# Patient Record
Sex: Female | Born: 1960 | Race: White | Hispanic: No | Smoking: Former smoker
Health system: Southern US, Community
[De-identification: ages and names within clinical notes are randomized; demographics above are authoritative.]

## PROBLEM LIST (undated history)

## (undated) DIAGNOSIS — IMO0001 Reserved for inherently not codable concepts without codable children: Secondary | ICD-10-CM

## (undated) DIAGNOSIS — F329 Major depressive disorder, single episode, unspecified: Secondary | ICD-10-CM

## (undated) DIAGNOSIS — I639 Cerebral infarction, unspecified: Secondary | ICD-10-CM

## (undated) DIAGNOSIS — R29898 Other symptoms and signs involving the musculoskeletal system: Secondary | ICD-10-CM

## (undated) DIAGNOSIS — J439 Emphysema, unspecified: Secondary | ICD-10-CM

## (undated) DIAGNOSIS — E785 Hyperlipidemia, unspecified: Secondary | ICD-10-CM

## (undated) DIAGNOSIS — K219 Gastro-esophageal reflux disease without esophagitis: Secondary | ICD-10-CM

## (undated) DIAGNOSIS — F32A Depression, unspecified: Secondary | ICD-10-CM

## (undated) HISTORY — DX: Other symptoms and signs involving the musculoskeletal system: R29.898

## (undated) HISTORY — DX: Emphysema, unspecified: J43.9

## (undated) HISTORY — PX: ABDOMINAL HYSTERECTOMY: SHX81

## (undated) HISTORY — PX: CHOLECYSTECTOMY: SHX55

## (undated) HISTORY — PX: EYE SURGERY: SHX253

## (undated) HISTORY — PX: TOTAL ABDOMINAL HYSTERECTOMY: SHX209

---

## 1995-08-20 HISTORY — PX: COLON SURGERY: SHX602

## 1995-08-20 HISTORY — PX: TUBAL LIGATION: SHX77

## 2001-12-29 ENCOUNTER — Emergency Department (HOSPITAL_COMMUNITY): Admission: EM | Admit: 2001-12-29 | Discharge: 2001-12-29 | Payer: Self-pay | Admitting: Emergency Medicine

## 2003-08-20 DIAGNOSIS — I639 Cerebral infarction, unspecified: Secondary | ICD-10-CM

## 2003-08-20 HISTORY — DX: Cerebral infarction, unspecified: I63.9

## 2003-12-05 ENCOUNTER — Emergency Department (HOSPITAL_COMMUNITY): Admission: AD | Admit: 2003-12-05 | Discharge: 2003-12-05 | Payer: Self-pay | Admitting: Emergency Medicine

## 2004-05-18 ENCOUNTER — Ambulatory Visit (HOSPITAL_COMMUNITY): Admission: RE | Admit: 2004-05-18 | Discharge: 2004-05-18 | Payer: Self-pay | Admitting: Obstetrics

## 2004-10-11 ENCOUNTER — Encounter: Admission: RE | Admit: 2004-10-11 | Discharge: 2004-10-11 | Payer: Self-pay | Admitting: Obstetrics

## 2005-06-26 ENCOUNTER — Ambulatory Visit (HOSPITAL_COMMUNITY): Admission: RE | Admit: 2005-06-26 | Discharge: 2005-06-26 | Payer: Self-pay | Admitting: Obstetrics

## 2005-08-16 ENCOUNTER — Ambulatory Visit (HOSPITAL_COMMUNITY): Admission: RE | Admit: 2005-08-16 | Discharge: 2005-08-16 | Payer: Self-pay | Admitting: Obstetrics

## 2005-08-16 ENCOUNTER — Encounter (INDEPENDENT_AMBULATORY_CARE_PROVIDER_SITE_OTHER): Payer: Self-pay | Admitting: *Deleted

## 2007-03-03 ENCOUNTER — Emergency Department (HOSPITAL_COMMUNITY): Admission: EM | Admit: 2007-03-03 | Discharge: 2007-03-04 | Payer: Self-pay | Admitting: Emergency Medicine

## 2007-06-19 ENCOUNTER — Emergency Department (HOSPITAL_COMMUNITY): Admission: EM | Admit: 2007-06-19 | Discharge: 2007-06-19 | Payer: Self-pay | Admitting: Emergency Medicine

## 2008-05-17 ENCOUNTER — Encounter: Admission: RE | Admit: 2008-05-17 | Discharge: 2008-05-17 | Payer: Self-pay | Admitting: Obstetrics

## 2008-08-04 ENCOUNTER — Emergency Department (HOSPITAL_COMMUNITY): Admission: EM | Admit: 2008-08-04 | Discharge: 2008-08-04 | Payer: Self-pay | Admitting: Emergency Medicine

## 2009-02-09 DIAGNOSIS — A6000 Herpesviral infection of urogenital system, unspecified: Secondary | ICD-10-CM | POA: Insufficient documentation

## 2009-03-10 ENCOUNTER — Encounter: Payer: Self-pay | Admitting: Obstetrics

## 2009-03-10 ENCOUNTER — Inpatient Hospital Stay (HOSPITAL_COMMUNITY): Admission: RE | Admit: 2009-03-10 | Discharge: 2009-03-13 | Payer: Self-pay | Admitting: Obstetrics

## 2009-09-27 DIAGNOSIS — H9190 Unspecified hearing loss, unspecified ear: Secondary | ICD-10-CM | POA: Insufficient documentation

## 2009-10-09 DIAGNOSIS — F32A Depression, unspecified: Secondary | ICD-10-CM | POA: Insufficient documentation

## 2010-02-02 DIAGNOSIS — E559 Vitamin D deficiency, unspecified: Secondary | ICD-10-CM | POA: Insufficient documentation

## 2010-11-25 LAB — CBC
HCT: 34.5 % — ABNORMAL LOW (ref 36.0–46.0)
HCT: 42.6 % (ref 36.0–46.0)
Hemoglobin: 11.9 g/dL — ABNORMAL LOW (ref 12.0–15.0)
Hemoglobin: 14.8 g/dL (ref 12.0–15.0)
MCHC: 34.7 g/dL (ref 30.0–36.0)
MCHC: 34.7 g/dL (ref 30.0–36.0)
MCV: 90.9 fL (ref 78.0–100.0)
MCV: 92.5 fL (ref 78.0–100.0)
Platelets: 339 10*3/uL (ref 150–400)
Platelets: 363 10*3/uL (ref 150–400)
RBC: 3.72 MIL/uL — ABNORMAL LOW (ref 3.87–5.11)
RBC: 4.68 MIL/uL (ref 3.87–5.11)
RDW: 12.5 % (ref 11.5–15.5)
RDW: 12.7 % (ref 11.5–15.5)
WBC: 13.1 10*3/uL — ABNORMAL HIGH (ref 4.0–10.5)
WBC: 8.6 10*3/uL (ref 4.0–10.5)

## 2010-11-25 LAB — PREGNANCY, URINE: Preg Test, Ur: NEGATIVE

## 2011-01-01 NOTE — Discharge Summary (Signed)
Erica Bradley, FUHS                 ACCOUNT NO.:  [id number]   MEDICAL RECORD NO.:  [id number]          PATIENT TYPE:  INP   LOCATION:  9304                          FACILITY:  [facility]   PHYSICIAN:  Roseanna Rainbow, M.D.DATE OF BIRTH:  1961/04/21   DATE OF ADMISSION:  03/10/2009  DATE OF DISCHARGE:  03/13/2009                               DISCHARGE SUMMARY   CHIEF COMPLAINT:  The patient is a 50 year old with severe dysmenorrhea,  who presents for total laparoscopic hysterectomy.   HISTORY OF PRESENT ILLNESS:  Please see the above.  The patient has a  history of an endometrial ablation in December 2006.  The menstrual flow  improved after the procedure.   ALLERGIES:  SULFA.   MEDICATIONS:  Topamax, doxycycline, Flagyl, Depakote, nortriptyline,  Pristiq.   PAST SURGICAL HISTORY:  Bilateral tubal ligation, eye surgery,  cholecystectomy.  Please see the above.   PAST MEDICAL HISTORY:  Bipolar d/o.   FAMILY HISTORY:  Cardiovascular disease.   PHYSICAL EXAMINATION:  VITAL SIGNS:  Temperature 98.4, pulse 80,  respirations 18, blood pressure 152/82.  GENERAL APPEARANCE:  Well nourished, well developed in no apparent  distress.  HEAD, EYES, EARS, NOSE AND THROAT:  Normocephalic, atraumatic.  NECK:  Supple.  LUNGS:  Clear to auscultation bilaterally.  HEART:  Regular rate and rhythm.  BREASTS:  No masses.  ABDOMEN: Soft, nontender.  No organomegaly.  PELVIC:  Normal EGBUS.  Uterus is anteverted, normal size.  No prolapse.  Adnexa; no masses, no tenderness.  EXTREMITIES:  No clubbing, cyanosis or edema.  SKIN:  Without rash.   ASSESSMENT:  Secondary dysmenorrhea.   PLAN:  Total laparoscopic hysterectomy.   HOSPITAL COURSE:  The patient was admitted and underwent a diagnostic  laparoscopy, total abdominal hysterectomy, bilateral salpingo-  oophorectomy and lysis of adhesions.  Please see the dictated operative  summary.  On postoperative day #1, a hemoglobin was 11.  The  remainder  of her hospital course is uneventful.  She was discharged to home on  postoperative day #3, tolerating a regular diet.   DISCHARGE DIAGNOSES:  Endometriosis, pelvic adhesions.   PROCEDURES:  Diagnostic laparoscopy, total abdominal hysterectomy,  bilateral salpingo-oophorectomy and lysis of adhesions.   CONDITION:  Good.   DIET:  Regular.   ACTIVITY:  Pelvic rest, progressive activity.   DISCHARGE MEDICATIONS:  1. Depakote 1000 mg daily.  2. Topamax 100 mg daily.  3. Nortriptyline 150 mg daily.  4. Percocet 1-2 tablets every 6 hours as needed.  5. Climara Pro apply weekly.   DISPOSITION:  The patient was to follow up in the office on July 30th  for staple removal.       Roseanna Rainbow, M.D.  Electronically Signed     LAJ/MEDQ  D:  03/13/2009  T:  03/13/2009  Job:  [id number]   cc:   Leonette Most A. Clearance Coots, M.D.  Fax: [phone]

## 2011-01-01 NOTE — Op Note (Signed)
[name], Bradley                 ACCOUNT NO.:  [id number]   MEDICAL RECORD NO.:  [id number]          PATIENT TYPE:  OIB   LOCATION:  9304                          FACILITY:  [facility]   PHYSICIAN:  Charles A. Clearance Coots, M.D.DATE OF BIRTH:  Nov 22, 1960   DATE OF PROCEDURE:  03/10/2009  DATE OF DISCHARGE:                               OPERATIVE REPORT   PREOPERATIVE DIAGNOSIS:  Severe dysmenorrhea.   POSTOPERATIVE DIAGNOSES:  1. Severe dysmenorrhea.  2. Multiple pelvic adhesions.   PROCEDURES:  1. Diagnostic laparoscopy.  2. Total abdominal hysterectomy.  3. Bilateral salpingo-oophorectomy.  4. Lysis of adhesions.   SURGEON:  Charles A. Clearance Coots, MD   ASSISTANT:  Roseanna Rainbow, MD   ANESTHESIA:  General.   ESTIMATED BLOOD LOSS:  800 mL   COMPLICATIONS:  None.   DRAINS:  Foley to gravity.   FINDINGS:  Multiple pelvic adhesions.   SPECIMEN:  Uterus, ovaries, fallopian tubes, and cervix.   DISPOSITION:  Specimen to Pathology.   OPERATION:  The patient was brought to the operating room and after  satisfactory general endotracheal anesthesia, the legs were brought up  in stirrups.  The abdomen and vagina were prepped and draped in the  usual sterile fashion.  The vaginal set up was done in routine fashion  for total laparoscopic hysterectomy. The small umbilical incision was  made with the scalpel and the OptiVu was then entered into the  peritoneal cavity in routine fashion without complications.  Two lateral  lower quadrant 5-mm ports were then placed.  Survey of the pelvis  revealed multiple pelvic adhesions.  The uterus not being very mobile  and an attempt was made to lyse some of the adhesions laparoscopically,  but after starting the lysis of adhesions laparoscopically it was  realized that the adhesions were too dense and the adhesive disease was  too complex for a laparoscopic approach.  A decision was made to abort  the laparoscopic attempt at hysterectomy and  proceed with open abdominal  hysterectomy.  All laparoscopic instruments were all removed from the  ports and a midline vertical subumbilical incision was made with a  scalpel down to the fascia.  The fascia was nicked in the midline and  the fascial incision was extended superiorly and inferiorly with curved  Mayo scissors.  The rectus muscle was then divided in the midline and  the peritoneum was entered in the subumbilical area where the OptiVu  trocar had previously entered.  The peritoneal incision was then  extended inferiorly down to the urinary bladder being careful to avoid  the urinary bladder.  The Bookwalter retractor was then placed in the  incision and the anterior and posterior blades were placed along with  two lateral blades.  The uterus was tightly bound down in pelvic  adhesions and these were bluntly and sharply lysed with Metzenbaum  scissors and bluntly.  The retroperitoneal space was then entered  laterally on the right side through the broad ligament and lateral  peritoneal surfaces and what appeared to be approximately 4-5 cm fluid-  filled mass was observed below  the ovary extending out  retroperitoneally.  The peritoneal incisions were then extended sharply  to the infundibulopelvic vessels on the right side and the peritoneal  space was then further developed down to the major vessels and the  ureter was identified on the right side and the infundibulopelvic  ligament with the vessels were then cross-clamped above the ureter with  clear visualization of the ureter.  With a curved Heaney clamp, the  infundibulopelvic vessels were doubly clamped, cut and free tie of 0  Vicryl was placed beneath the clamp and a transfixion suture of 0 Vicryl  was placed above the knot.  The vesicouterine fold of peritoneum above  the reflection of the urinary bladder was then undermined and incised  with Metzenbaum scissors and the urinary bladder was separated from the  lower  uterine segment and cervix, and placing it well out of the  operative field with blunt and sharp dissection.  Further lysis of  adhesions were done and the uterine vessels were skeletonized on the  right side and were doubly clamped with curved Heaney forceps and a  straight Heaney forceps placed above the bottom clamp.  The uterine  vessels were then cut and suture ligated with transfixion sutures of 0  Vicryl.  Same procedure was performed on the opposite side without  complications.  The cardinal ligaments were then clamped, cut and suture  ligated with transfixion sutures of 0 Vicryl down to the uterosacral  ligaments which were clamped, cut and suture ligated with transfixion  sutures of 0 Vicryl and were held with hemostats for further closure of  the vaginal cuff.  The uterus had been transected at the uterocervical  junction for better exposure to remove the cervical stump.  Curved  Heaney clamps were then placed across each corner of the vagina, meeting  in the center and the cervix was transected away from the vaginal cuff.  The vaginal cuff was then closed with corner transfixion suture of 0  Vicryl on each side and the center of the vaginal cuff was closed with  interrupted sutures of 0 Vicryl.  Hemostasis was excellent.  Pelvic  cavity was thoroughly irrigated with warm saline solution and all clots  were removed.  All the pedicles and peritoneal surfaces were examined  for hemostasis and areas of bleeding were coagulated with the Bovie or  suture ligated.  There was no active bleeding noted at this point, and  all packing and the Bookwalter retractor was then removed.  Peritoneum  was grasped with Linzie forceps.  The peritoneum and fascia was closed as  one with continuous suture of 0 Vicryl from each corner to the center.  Subcutaneous tissue was thoroughly irrigated with warm saline solution  and all areas of subcutaneous bleeding were coagulated with the Bovie.  The skin  was then closed with sterile surgical stainless steel staples.  Sterile bandage was applied to the incision closure.  Surgical  technician indicated that all needle, sponge, and instrument counts were  correct x2.  The patient tolerated the procedure well and was  transported to the recovery room in satisfactory condition.      Charles A. Clearance Coots, M.D.  Electronically Signed     CAH/MEDQ  D:  03/10/2009  T:  03/11/2009  Job:  [id number]

## 2011-01-04 NOTE — Op Note (Signed)
Erica Bradley, Erica Bradley                 ACCOUNT NO.:  [id number]   MEDICAL RECORD NO.:  [id number]          PATIENT TYPE:  AMB   LOCATION:  SDC                           FACILITY:  [facility]   PHYSICIAN:  Charles A. Clearance Coots, M.D.DATE OF BIRTH:  [birth year]   DATE OF PROCEDURE:  08/16/2005  DATE OF DISCHARGE:                                 OPERATIVE REPORT   PREOPERATIVE DIAGNOSIS:  Menorrhagia.   POSTOPERATIVE DIAGNOSIS:  Menorrhagia.   PROCEDURE:  Hysteroscopy, dilation and curettage, NovaSure bipolar  endometrial ablation.   SURGEON:  Charles A. Clearance Coots, M.D.   ANESTHESIA:  General.   ESTIMATED BLOOD LOSS:  Minimal.   COMPLICATIONS:  None.   FINDINGS:  Endometrial cavity clear without polyps or submucous fibroids on  hysteroscopic survey.   OPERATION:  The patient was brought to the operating room and after  satisfactory general endotracheal anesthesia, the vagina was prepped and  draped in the usual sterile fashion.  The urinary bladder was emptied of  approximately 100 mL of clear urine.  Bimanual examination revealed the  uterus to be midposition, normal size, shape and contour.  A weighted  speculum was inserted in the vaginal vault and the Sims retractor was used  to isolate the cervix.  The anterior lip of the cervix was grasped with a  single-tooth tenaculum.  The cervical canal was then sounded with a Hegar  dilator up to the internal os, which was 4 cm in length.  The cervix was  then dilated to a #23 Pratt dilator.  The hysteroscope was introduced into  the uterine cavity and a hysteroscopic survey was done.  There were no  endometrial polyps or submucous fibroids observed.  The hysteroscope was  removed.  A small sharp curette was then used to perform endometrial  curettage and the specimen was submitted to pathology for evaluation.  NovaSure bipolar endometrial ablation was then performed after sounding the  uterus to 10 cm with a cervical length of 4 cm, with a total what  cavity  length of 6 cm.  Cavity width was 4.9 cm.  Bipolar ablation was then  performed in routine fashion at a power of 162 watts, time of 86 seconds.  All instruments were then retired at the conclusion of the procedure.  Hysteroscopy was again performed and adequate ablation was confirmed.  All  instruments were  then retired, and the patient was reversed from the  general anesthetic and was taken to the recovery room in good condition.      Charles A. Clearance Coots, M.D.  Electronically Signed    CAH/MEDQ  D:  08/16/2005  T:  08/16/2005  Job:  [id number]

## 2011-05-24 LAB — DIFFERENTIAL
Basophils Absolute: 0.1 10*3/uL (ref 0.0–0.1)
Basophils Relative: 1 % (ref 0–1)
Eosinophils Absolute: 0.1 10*3/uL (ref 0.0–0.7)
Eosinophils Relative: 1 % (ref 0–5)
Lymphocytes Relative: 24 % (ref 12–46)
Lymphs Abs: 2.6 10*3/uL (ref 0.7–4.0)
Monocytes Absolute: 0.5 10*3/uL (ref 0.1–1.0)
Monocytes Relative: 5 % (ref 3–12)
Neutro Abs: 7.5 10*3/uL (ref 1.7–7.7)
Neutrophils Relative %: 69 % (ref 43–77)

## 2011-05-24 LAB — CBC
HCT: 45.6 % (ref 36.0–46.0)
Hemoglobin: 15.1 g/dL — ABNORMAL HIGH (ref 12.0–15.0)
MCHC: 33.1 g/dL (ref 30.0–36.0)
MCV: 89.2 fL (ref 78.0–100.0)
Platelets: 322 10*3/uL (ref 150–400)
RBC: 5.11 MIL/uL (ref 3.87–5.11)
RDW: 13 % (ref 11.5–15.5)
WBC: 10.9 10*3/uL — ABNORMAL HIGH (ref 4.0–10.5)

## 2011-05-24 LAB — GC/CHLAMYDIA PROBE AMP, GENITAL
Chlamydia, DNA Probe: POSITIVE — AB
GC Probe Amp, Genital: NEGATIVE

## 2011-05-24 LAB — POCT I-STAT, CHEM 8
BUN: 21 mg/dL (ref 6–23)
Calcium, Ion: 1.33 mmol/L — ABNORMAL HIGH (ref 1.12–1.32)
Chloride: 107 mEq/L (ref 96–112)
Creatinine, Ser: 1 mg/dL (ref 0.4–1.2)
Glucose, Bld: 94 mg/dL (ref 70–99)
HCT: 48 % — ABNORMAL HIGH (ref 36.0–46.0)
Hemoglobin: 16.3 g/dL — ABNORMAL HIGH (ref 12.0–15.0)
Potassium: 4 mEq/L (ref 3.5–5.1)
Sodium: 139 mEq/L (ref 135–145)
TCO2: 25 mmol/L (ref 0–100)

## 2011-05-24 LAB — WET PREP, GENITAL
Clue Cells Wet Prep HPF POC: NONE SEEN
Trich, Wet Prep: NONE SEEN
WBC, Wet Prep HPF POC: NONE SEEN
Yeast Wet Prep HPF POC: NONE SEEN

## 2011-05-29 LAB — I-STAT 8, (EC8 V) (CONVERTED LAB)
Acid-base deficit: 4 — ABNORMAL HIGH
BUN: 14
Bicarbonate: 21.6
Chloride: 108
Glucose, Bld: 89
HCT: 45
Hemoglobin: 15.3 — ABNORMAL HIGH
Operator id: 288831
Potassium: 4.1
Sodium: 138
TCO2: 23
pCO2, Ven: 41 — ABNORMAL LOW
pH, Ven: 7.329 — ABNORMAL HIGH

## 2011-05-29 LAB — CBC
HCT: 42.6
Hemoglobin: 14.5
MCHC: 34
MCV: 91
Platelets: 344
RBC: 4.68
RDW: 12.7
WBC: 7.2

## 2011-05-29 LAB — POCT I-STAT CREATININE
Creatinine, Ser: 1
Operator id: 288831

## 2011-05-29 LAB — DIFFERENTIAL
Basophils Absolute: 0.1
Basophils Relative: 2 — ABNORMAL HIGH
Eosinophils Absolute: 0.2
Eosinophils Relative: 3
Lymphocytes Relative: 35
Lymphs Abs: 2.5
Monocytes Absolute: 0.4
Monocytes Relative: 6
Neutro Abs: 3.9
Neutrophils Relative %: 54

## 2011-05-29 LAB — POCT CARDIAC MARKERS
CKMB, poc: <1 — ABNORMAL LOW
Myoglobin, poc: 88.2
Operator id: 288831
Troponin i, poc: <0.05
Troponin i, poc: <0.05

## 2011-06-03 LAB — URINALYSIS, ROUTINE W REFLEX MICROSCOPIC
Bilirubin Urine: NEGATIVE
Glucose, UA: NEGATIVE
Nitrite: NEGATIVE
Specific Gravity, Urine: 1.008
pH: 6.5

## 2011-06-03 LAB — DIFFERENTIAL
Eosinophils Relative: 3
Lymphocytes Relative: 54 — ABNORMAL HIGH
Lymphs Abs: 4.1 — ABNORMAL HIGH
Monocytes Absolute: 0.5
Monocytes Relative: 6

## 2011-06-03 LAB — CBC
HCT: 40.8
Hemoglobin: 14.2
WBC: 7.6

## 2011-06-03 LAB — BASIC METABOLIC PANEL
GFR calc non Af Amer: 55 — ABNORMAL LOW
Potassium: 3.7
Sodium: 134 — ABNORMAL LOW

## 2013-04-29 ENCOUNTER — Encounter: Payer: Self-pay | Admitting: *Deleted

## 2013-04-29 ENCOUNTER — Emergency Department
Admission: EM | Admit: 2013-04-29 | Discharge: 2013-04-29 | Disposition: A | Discharge status: Home / Self Care | Payer: Self-pay | Source: Home / Self Care | Attending: Emergency Medicine | Admitting: Emergency Medicine

## 2013-04-29 DIAGNOSIS — R1900 Intra-abdominal and pelvic swelling, mass and lump, unspecified site: Secondary | ICD-10-CM

## 2013-04-29 DIAGNOSIS — H6121 Impacted cerumen, right ear: Secondary | ICD-10-CM

## 2013-04-29 DIAGNOSIS — H6120 Impacted cerumen, unspecified ear: Secondary | ICD-10-CM

## 2013-04-29 DIAGNOSIS — D229 Melanocytic nevi, unspecified: Secondary | ICD-10-CM

## 2013-04-29 DIAGNOSIS — D239 Other benign neoplasm of skin, unspecified: Secondary | ICD-10-CM

## 2013-04-29 HISTORY — DX: Hyperlipidemia, unspecified: E78.5

## 2013-04-29 NOTE — ED Notes (Signed)
Erica Bradley c/o possible piece of cotton in her right ear for a couple months seen by a hearing specialists who could not take it our, no pain. Mole to right ribs, black in color x 3 years. Knot to lower abdomen x 9 months, denies pain.

## 2013-04-29 NOTE — ED Provider Notes (Signed)
CSN: [id number]     Arrival date & time 04/29/13  4098 History   First MD Initiated Contact with Patient 04/29/13 1012     Chief Complaint  Patient presents with  . Foreign Body in Ear    possible piece of cotton  . Nevus   (Consider location/radiation/quality/duration/timing/severity/associated sxs/prior Treatment) HPI  The patient presents with multiple medical concerns: 1) first she has a mole on her left flank that has been there for a couple years.  It is dark but has not enlarged in the last 2 years.  She states that when she was younger she did have a lot of sun exposure. 2) second she has had some abdominal swelling or a knot in her lower stomach for the last year.  There is no pain but she occasionally feels a knot there.  She did have an abdominal hysterectomy done 4 years ago.  No nausea vomiting fever chills.  No other constitutional symptoms. 3) third she states that she saw an audiologist last year and was told that she had some cotton in her right ear.  No pain or irritation or swelling.  She will like to have that checked out today as well. Past Medical History  Diagnosis Date  . Back complaints   . Hyperlipidemia    Past Surgical History  Procedure Laterality Date  . Abdominal hysterectomy    . Cholecystectomy    . Tubal ligation    . Eye surgery     Family History  Problem Relation Age of Onset  . Cancer Mother     lung ca   History  Substance Use Topics  . Smoking status: Current Every Day Smoker -- 0.25 packs/day    Types: Cigarettes  . Smokeless tobacco: Never Used     Comment: quit  ten years ago.  . Alcohol Use: Yes   OB History   Grav Para Term Preterm Abortions TAB SAB Ect Mult Living                 Review of Systems  All other systems reviewed and are negative.    Allergies  Sulfur  Home Medications   Current Outpatient Rx  Name  Route  Sig  Dispense  Refill  . nortriptyline (PAMELOR) 75 MG capsule   Oral   Take 75 mg by mouth at  bedtime.          BP 140/101  Pulse 96  Temp(Src) 97.5 F (36.4 C) (Oral)  Resp 14  Ht 5\' 5"  (1.651 m)  Wt 185 lb (83.915 kg)  BMI 30.79 kg/m2  SpO2 100% Physical Exam  Nursing note and vitals reviewed. Constitutional: She is oriented to person, place, and time. She appears well-developed and well-nourished.  HENT:  Head: Normocephalic and atraumatic.  Nose: Nose normal.  Left ear is normal.  Cerumen in right ear.  No cotton or foreign bodies are seen.  Eyes: No scleral icterus.  Neck: Neck supple.  Cardiovascular: Regular rhythm and normal heart sounds.   Pulmonary/Chest: Effort normal and breath sounds normal. No respiratory distress.  Abdominal:    Neurological: She is alert and oriented to person, place, and time.  Skin: Skin is warm and dry.     Psychiatric: She has a normal mood and affect. Her speech is normal.    ED Course  Procedures (including critical care time) Labs Review Labs Reviewed - No data to display Imaging Review No results found.  MDM   1. Impacted ear wax,  right   2. Pigmented nevus   3. Abdominal swelling     1) For the mole, it is large and probably needs to be biopsied at least if not excised altogether, I would prefer that the patient does see a dermatologist for this procedure as it may be more involved /larger than we're prepared to do.  And since patient with no insurance, a derm may decide to just watch if just a benign nevus.   2) For the abdominal swelling, likely is a insertional hernia versus old scar tissue.  I do not see any red flags therefore advised the patient that she can watch it.  Alternatives would be to get an ultrasound and a surgical consult.  However patient states that she does not have insurance and cannot afford that right now.  So she will watch it and see if there is any pain or enlarging or worsening and then followup.  3)  For the ear, I don't see any cotton in the ear but she does have wax.  We are going to  flush that out and see if we can see anything more in there.  No signs of infection.  After flushing that he still cannot see any foreign bodies in her ear and no erythema in the wax has been removed.   Marlaine Hind, MD 04/29/13 1059

## 2013-10-28 ENCOUNTER — Encounter: Payer: Self-pay | Admitting: Family Medicine

## 2013-10-28 ENCOUNTER — Ambulatory Visit (INDEPENDENT_AMBULATORY_CARE_PROVIDER_SITE_OTHER): Payer: Commercial Managed Care - PPO | Admitting: Family Medicine

## 2013-10-28 ENCOUNTER — Encounter: Payer: Self-pay | Admitting: *Deleted

## 2013-10-28 VITALS — BP 149/96 | HR 102 | Ht 65.0 in | Wt 173.0 lb

## 2013-10-28 DIAGNOSIS — E785 Hyperlipidemia, unspecified: Secondary | ICD-10-CM

## 2013-10-28 DIAGNOSIS — L309 Dermatitis, unspecified: Secondary | ICD-10-CM

## 2013-10-28 DIAGNOSIS — Z860100 Personal history of colon polyps, unspecified: Secondary | ICD-10-CM | POA: Insufficient documentation

## 2013-10-28 DIAGNOSIS — I6529 Occlusion and stenosis of unspecified carotid artery: Secondary | ICD-10-CM | POA: Insufficient documentation

## 2013-10-28 DIAGNOSIS — D649 Anemia, unspecified: Secondary | ICD-10-CM

## 2013-10-28 DIAGNOSIS — L259 Unspecified contact dermatitis, unspecified cause: Secondary | ICD-10-CM

## 2013-10-28 DIAGNOSIS — L820 Inflamed seborrheic keratosis: Secondary | ICD-10-CM

## 2013-10-28 DIAGNOSIS — IMO0001 Reserved for inherently not codable concepts without codable children: Secondary | ICD-10-CM

## 2013-10-28 DIAGNOSIS — R030 Elevated blood-pressure reading, without diagnosis of hypertension: Secondary | ICD-10-CM

## 2013-10-28 DIAGNOSIS — Z131 Encounter for screening for diabetes mellitus: Secondary | ICD-10-CM

## 2013-10-28 DIAGNOSIS — Z8673 Personal history of transient ischemic attack (TIA), and cerebral infarction without residual deficits: Secondary | ICD-10-CM | POA: Insufficient documentation

## 2013-10-28 DIAGNOSIS — G459 Transient cerebral ischemic attack, unspecified: Secondary | ICD-10-CM

## 2013-10-28 MED ORDER — TRIAMCINOLONE ACETONIDE 0.1 % EX CREA: TOPICAL_CREAM | CUTANEOUS | Status: DC

## 2013-10-28 MED ORDER — ASPIRIN 81 MG PO TABS: 81.0000 mg | ORAL_TABLET | Freq: Every day | ORAL | Status: DC

## 2013-10-28 NOTE — Progress Notes (Signed)
CC: Erica Bradley is a 53 y.o. female is here for Establish Care and wants lab work   Subjective: HPI:  Pleasant 53 year old here to establish care who has not had medical care in many years now  She reports a history of colonic polyps that were precancerous removed in 2011 she was due for followup in 3 years after that colonoscopy. She denies any unintentional weight loss or rectal bleeding. Denies abdominal pain  On chart review she has a history of anemia she is not currently taking any vitamin D or iron supplementation. She denies exertional chest pain but does endorse sternal twinge when sitting for long periods of time. Denies shortness of breath nor bruising or bleeding abnormalities recently or remotely.  She points out a rash on both forearms and both shins as in present for the past one to 2 weeks. It came on after she began using a used mattress and has slightly been improving since she discarded said mattress, it is slightly itchy but does not bother her otherwise. She's been using lotion which may or may not helping the resolution.  She reports a history of a transient ischemic attack that occurred when she was in her 41s. She was seen a neurologist following the episode who told her that she had narrowing of her right carotid artery but not to a significant degree that required surgery.  She was once on a statin but decided to stop it. She has a history of hyperlipidemia. She describes her TIA as right-sided facial weakness progressing to right arm and leg weakness that lasted a matter of less than 1 minute and resolved completely without intervention. She tells me she had an MRI afterwards which did not show a stroke. She's no longer taking a baby aspirin for no particular reason  She has a mole on her left flank that she is concerned as a melanoma due to her family history of malignant melanoma. The small has been there for over 2 years has not been getting bigger or smaller however  is quite irritated when she wears a bra. No interventions as of yet. Denies new or enlarging pigmented skin lesions elsewhere on the body.  Review of Systems - General ROS: negative for - chills, fever, night sweats, weight gain or weight loss Ophthalmic ROS: negative for - decreased vision Psychological ROS: negative for - anxiety or depression ENT ROS: negative for - hearing change, nasal congestion, tinnitus or allergies Hematological and Lymphatic ROS: negative for - bleeding problems, bruising or swollen lymph nodes Breast ROS: negative Respiratory ROS: no cough, shortness of breath, or wheezing Cardiovascular ROS: no  dyspnea on exertion Gastrointestinal ROS: no abdominal pain, change in bowel habits, or black or bloody stools Genito-Urinary ROS: negative for - genital discharge, genital ulcers, incontinence or abnormal bleeding from genitals Musculoskeletal ROS: negative for - joint pain or muscle pain Neurological ROS: negative for - headaches or memory loss Dermatological ROS: negative for lumps, mole changes, rash and skin lesion changes other than that described above   Past Medical History  Diagnosis Date  . Back complaints   . Hyperlipidemia     Past Surgical History  Procedure Laterality Date  . Abdominal hysterectomy    . Cholecystectomy    . Tubal ligation    . Eye surgery     Family History  Problem Relation Age of Onset  . Cancer Mother     lung ca    History   Social History  . Marital  Status: Divorced    Spouse Name: N/A    Number of Children: N/A  . Years of Education: N/A   Occupational History  . Not on file.   Social History Main Topics  . Smoking status: Current Every Day Smoker -- 0.25 packs/day    Types: Cigarettes  . Smokeless tobacco: Never Used     Comment: quit  ten years ago.  . Alcohol Use: Yes  . Drug Use: No  . Sexual Activity: Not on file   Other Topics Concern  . Not on file   Social History Narrative  . No narrative on  file     Objective: BP 149/96  Pulse 102  Ht 5\' 5"  (1.651 m)  Wt 173 lb (78.472 kg)  BMI 28.79 kg/m2  General: Alert and Oriented, No Acute Distress HEENT: Pupils equal, round, reactive to light. Conjunctivae clear.  External ears unremarkable, canals clear with intact TMs with appropriate landmarks.  Middle ear appears open without effusion. Pink inferior turbinates.  Moist mucous membranes, pharynx without inflammation nor lesions.  Neck supple without palpable lymphadenopathy nor abnormal masses. Lungs: Clear to auscultation bilaterally, no wheezing/ronchi/rales.  Comfortable work of breathing. Good air movement. Cardiac: Regular rate and rhythm. Normal S1/S2.  No murmurs, rubs, nor gallops.   no carotid bruits Extremities: No peripheral edema.  Strong peripheral pulses.  Mental Status: No depression, anxiety, nor agitation. Skin: Warm and dry. There is a 1/2 cm diameter waxy stuck on appearing  Pigmented lesion on her left flank homogenous in color regular borders classic appearance of seborrheic keratosis. Macular and urticarial rash on both forearms and shins mild in severity with excoriations  Assessment & Plan: Elianne was seen today for establish care and wants lab work.  Diagnoses and associated orders for this visit:  Elevated blood pressure - BASIC METABOLIC PANEL WITH GFR  Anemia - CBC  Diabetes mellitus screening - BASIC METABOLIC PANEL WITH GFR  Dermatitis - triamcinolone cream (KENALOG) 0.1 %; Apply to affected areas twice a day for up to two weeks, avoid face.  Carotid stenosis  TIA (transient ischemic attack) - aspirin 81 MG tablet; Take 1 tablet (81 mg total) by mouth daily.  Hyperlipidemia - Lipid Profile  Inflamed seborrheic keratosis  Personal history of colonic polyps - Ambulatory referral to Gastroenterology    Elevated blood pressure: Checking renal function Anemia: Due for repeat hemoglobin  overdue for routine type II diabetic  screening Dermatitis: Does not appear that she has any active scabies or mites infection however this does look like contact irritation or even bedbugs therefore start triamcinolone, should continue to improve now that she has removed the offending mattress TIA: Awaiting outside records I strongly encouraged her to start on a daily baby aspirin Hyperlipidemia: Due for repeat annual lipid panel she will most likely need to start a statin given TIA Inflamed seborrheic keratosis: Discussed the option of cryotherapy which she is quite interested in, this was performed without complication today History of colonic polyps: Referral to GI for consideration of colonoscopy  Return in about 4 weeks (around 11/25/2013) for Routine Followup.  Cryotherapy Procedure Note  Pre-operative Diagnosis: Inflammed SK  Post-operative Diagnosis: Inflammed SK  Locations: Left flank on bra line  Indications: persistent pain  Anesthesia: None  Procedure Details  History of allergy to iodine: no. Pacemaker? no.  Patient informed of risks (permanent scarring, infection, light or dark discoloration, bleeding, infection, weakness, numbness and recurrence of the lesion) and benefits of the procedure and verbal informed  consent obtained.  The areas are treated with liquid nitrogen therapy, frozen until ice ball extended 2 mm beyond lesion, allowed to thaw, and treated again. The patient tolerated procedure well.  The patient was instructed on post-op care, warned that there may be blister formation, redness and pain. Recommend OTC analgesia as needed for pain.  Condition: Stable  Complications: none.  Plan: 1. Instructed to keep the area dry and covered for 24-48h and clean thereafter. 2. Warning signs of infection were reviewed.   3. Recommended that the patient use OTC analgesics as needed for pain.  4. Return in 1 month.

## 2013-11-08 ENCOUNTER — Encounter: Payer: Self-pay | Admitting: Gastroenterology

## 2013-11-18 ENCOUNTER — Ambulatory Visit (INDEPENDENT_AMBULATORY_CARE_PROVIDER_SITE_OTHER): Payer: Commercial Managed Care - PPO | Admitting: Sports Medicine

## 2013-11-18 ENCOUNTER — Ambulatory Visit (INDEPENDENT_AMBULATORY_CARE_PROVIDER_SITE_OTHER): Payer: Commercial Managed Care - PPO

## 2013-11-18 ENCOUNTER — Encounter: Payer: Self-pay | Admitting: Sports Medicine

## 2013-11-18 VITALS — BP 146/96 | HR 95 | Ht 68.0 in | Wt 177.0 lb

## 2013-11-18 DIAGNOSIS — R1013 Epigastric pain: Secondary | ICD-10-CM

## 2013-11-18 DIAGNOSIS — R141 Gas pain: Secondary | ICD-10-CM

## 2013-11-18 DIAGNOSIS — R110 Nausea: Secondary | ICD-10-CM

## 2013-11-18 DIAGNOSIS — R142 Eructation: Secondary | ICD-10-CM

## 2013-11-18 DIAGNOSIS — R143 Flatulence: Secondary | ICD-10-CM

## 2013-11-18 MED ORDER — SUCRALFATE 1 G PO TABS: 1.0000 g | ORAL_TABLET | Freq: Four times a day (QID) | ORAL | Status: DC

## 2013-11-18 MED ORDER — PROMETHAZINE HCL 25 MG PO TABS: 25.0000 mg | ORAL_TABLET | Freq: Four times a day (QID) | ORAL | Status: DC | PRN

## 2013-11-18 MED ORDER — ESOMEPRAZOLE MAGNESIUM 40 MG PO CPDR: DELAYED_RELEASE_CAPSULE | ORAL | Status: DC

## 2013-11-18 NOTE — Patient Instructions (Signed)
Peptic Ulcer A peptic ulcer is a sore in the lining of in your esophagus (esophageal ulcer), stomach (gastric ulcer), or in the first part of your small intestine (duodenal ulcer). The ulcer causes erosion into the deeper tissue. CAUSES  Normally, the lining of the stomach and the small intestine protects itself from the acid that digests food. The protective lining can be damaged by:  An infection caused by a bacterium called Helicobacter pylori (H. pylori).  Regular use of nonsteroidal anti-inflammatory drugs (NSAIDs), such as ibuprofen or aspirin.  Smoking tobacco. Other risk factors include being older than 50, drinking alcohol excessively, and having a family history of ulcer disease.  SYMPTOMS   Burning pain or gnawing in the area between the chest and the belly button.  Heartburn.  Nausea and vomiting.  Bloating. The pain can be worse on an empty stomach and at night. If the ulcer results in bleeding, it can cause:  Black, tarry stools.  Vomiting of bright red blood.  Vomiting of coffee ground looking materials. DIAGNOSIS  A diagnosis is usually made based upon your history and an exam. Other tests and procedures may be performed to find the cause of the ulcer. Finding a cause will help determine the best treatment. Tests and procedures may include:  Blood tests, stool tests, or breath tests to check for the bacterium H. pylori.  An upper gastrointestinal (GI) series of the esophagus, stomach, and small intestine.  An endoscopy to examine the esophagus, stomach, and small intestine.  A biopsy. TREATMENT  Treatment may include:  Eliminating the cause of the ulcer, such as smoking, NSAIDs, or alcohol.  Medicines to reduce the amount of acid in your digestive tract.  Antibiotic medicines if the ulcer is caused by the H. pylori bacterium.  An upper endoscopy to treat a bleeding ulcer.  Surgery if the bleeding is severe or if the ulcer created a hole somewhere in the  digestive system. HOME CARE INSTRUCTIONS   Avoid tobacco, alcohol, and caffeine. Smoking can increase the acid in the stomach, and continued smoking will impair the healing of ulcers.  Avoid foods and drinks that seem to cause discomfort or aggravate your ulcer.  Only take medicines as directed by your caregiver. Do not substitute over-the-counter medicines for prescription medicines without talking to your caregiver.  Keep any follow-up appointments and tests as directed. SEEK MEDICAL CARE IF:   Your do not improve within 7 days of starting treatment.  You have ongoing indigestion or heartburn. SEEK IMMEDIATE MEDICAL CARE IF:   You have sudden, sharp, or persistent abdominal pain.  You have bloody or dark black, tarry stools.  You vomit blood or vomit that looks like coffee grounds.  You become light headed, weak, or feel faint.  You become sweaty or clammy. MAKE SURE YOU:   Understand these instructions.  Will watch your condition.  Will get help right away if you are not doing well or get worse. Document Released: 08/02/2000 Document Revised: 04/29/2012 Document Reviewed: 03/04/2012 ExitCare Patient Information 2014 [employer].  

## 2013-11-18 NOTE — Progress Notes (Signed)
  Subjective:    CC: Abdominal pain  HPI: This is a pleasant 53 year old female, she has a long history of abdominal issues, typically has sudden urge to stool after eating suggestive of irritable bowel syndrome. Unfortunately she ate two hot dogs earlier today and had immediate epigastric pain, gnawing in nature, associated with nausea, no diarrhea, no vomiting. Moderate, persistent pain. She does have an colonoscopy scheduled, she does not have any upper endoscopy scheduled, she also is a history of anemia. No dysuria, no vaginal discharge.  Past medical history, Surgical history, Family history not pertinant except as noted below, Social history, Allergies, and medications have been entered into the medical record, reviewed, and no changes needed.   Review of Systems: No fevers, chills, night sweats, weight loss, chest pain, or shortness of breath.   Objective:    General: Well Developed, well nourished, and in no acute distress.  Neuro: Alert and oriented x3, extra-ocular muscles intact, sensation grossly intact.  HEENT: Normocephalic, atraumatic, pupils equal round reactive to light, neck supple, no masses, no lymphadenopathy, thyroid nonpalpable.  Skin: Warm and dry, no rashes. Cardiac: Regular rate and rhythm, no murmurs rubs or gallops, no lower extremity edema.  Respiratory: Clear to auscultation bilaterally. Not using accessory muscles, speaking in full sentences. Abdomen: Soft, minimally tender to palpation in the epigastrium, no guarding, no rebound tenderness, rigidity, normal bowel sounds, no costovertebral angle tenderness.   GI cocktail was given today.  Impression and Recommendations:

## 2013-11-18 NOTE — Assessment & Plan Note (Signed)
Recently consumed a hot dog, and then had immediate nausea, and gnawing epigastric pain. Considering recent anemia, the symptoms are highly suggestive of dyspepsia, be it gastritis or peptic ulcer disease. Nexium samples given, Carafate, CBC, CMET, lipase, H. pylori. Phenergan for nausea. She does have a colonoscopy coming up, I do think she would benefit from an upper endoscopy as well. She will contact us with her gastroenterologist's name and we will contact them to facilitate scheduling of the upper endoscopy.

## 2013-11-19 LAB — CBC WITH DIFFERENTIAL/PLATELET
Basophils Absolute: 0.1 10*3/uL (ref 0.0–0.1)
Basophils Relative: 1 % (ref 0–1)
Eosinophils Absolute: 0.3 K/uL (ref 0.0–0.7)
Eosinophils Relative: 3 % (ref 0–5)
HCT: 43.7 % (ref 36.0–46.0)
Hemoglobin: 14.7 g/dL (ref 12.0–15.0)
Lymphocytes Relative: 44 % (ref 12–46)
Lymphs Abs: 3.8 K/uL (ref 0.7–4.0)
MCH: 29.5 pg (ref 26.0–34.0)
MCHC: 33.6 g/dL (ref 30.0–36.0)
MCV: 87.8 fL (ref 78.0–100.0)
Monocytes Absolute: 0.5 10*3/uL (ref 0.1–1.0)
Monocytes Relative: 6 % (ref 3–12)
Neutro Abs: 4 K/uL (ref 1.7–7.7)
Neutrophils Relative %: 46 % (ref 43–77)
Platelets: 389 K/uL (ref 150–400)
RBC: 4.98 MIL/uL (ref 3.87–5.11)
RDW: 13.2 % (ref 11.5–15.5)
WBC: 8.7 K/uL (ref 4.0–10.5)

## 2013-11-19 LAB — H. PYLORI ANTIBODY, IGG: H Pylori IgG: 0.44 {ISR}

## 2013-11-19 LAB — COMPREHENSIVE METABOLIC PANEL WITH GFR
ALT: 25 U/L (ref 0–35)
Albumin: 4.4 g/dL (ref 3.5–5.2)
Alkaline Phosphatase: 97 U/L (ref 39–117)
CO2: 26 meq/L (ref 19–32)
Glucose, Bld: 98 mg/dL (ref 70–99)
Potassium: 4 meq/L (ref 3.5–5.3)
Sodium: 140 meq/L (ref 135–145)
Total Bilirubin: 0.3 mg/dL (ref 0.2–1.2)
Total Protein: 7 g/dL (ref 6.0–8.3)

## 2013-11-19 LAB — COMPREHENSIVE METABOLIC PANEL
AST: 20 U/L (ref 0–37)
BUN: 16 mg/dL (ref 6–23)
Calcium: 9.9 mg/dL (ref 8.4–10.5)
Chloride: 102 mEq/L (ref 96–112)
Creat: 0.88 mg/dL (ref 0.50–1.10)

## 2013-11-19 LAB — LIPASE: Lipase: 23 U/L (ref 0–75)

## 2013-11-22 ENCOUNTER — Other Ambulatory Visit: Payer: Self-pay

## 2013-11-22 ENCOUNTER — Telehealth: Payer: Self-pay | Admitting: Family Medicine

## 2013-11-22 DIAGNOSIS — R1013 Epigastric pain: Secondary | ICD-10-CM

## 2013-11-22 NOTE — Telephone Encounter (Signed)
Suanne Marker has already talked with this patient

## 2013-11-22 NOTE — Telephone Encounter (Signed)
Pt called. Script did not get to her phamacy and her Fredrich Birks wants to know when can she return to work.

## 2013-11-25 ENCOUNTER — Ambulatory Visit (INDEPENDENT_AMBULATORY_CARE_PROVIDER_SITE_OTHER): Payer: Commercial Managed Care - PPO | Admitting: Family Medicine

## 2013-11-25 ENCOUNTER — Encounter: Payer: Self-pay | Admitting: Family Medicine

## 2013-11-25 VITALS — BP 152/98 | HR 90 | Ht 65.0 in | Wt 178.0 lb

## 2013-11-25 DIAGNOSIS — R531 Weakness: Secondary | ICD-10-CM

## 2013-11-25 DIAGNOSIS — L309 Dermatitis, unspecified: Secondary | ICD-10-CM

## 2013-11-25 DIAGNOSIS — L259 Unspecified contact dermatitis, unspecified cause: Secondary | ICD-10-CM

## 2013-11-25 DIAGNOSIS — R5383 Other fatigue: Secondary | ICD-10-CM

## 2013-11-25 DIAGNOSIS — R1013 Epigastric pain: Secondary | ICD-10-CM

## 2013-11-25 DIAGNOSIS — R5381 Other malaise: Secondary | ICD-10-CM

## 2013-11-25 DIAGNOSIS — I10 Essential (primary) hypertension: Secondary | ICD-10-CM

## 2013-11-25 NOTE — Patient Instructions (Signed)
DASH Diet  The DASH diet stands for "Dietary Approaches to Stop Hypertension." It is a healthy eating plan that has been shown to reduce high blood pressure (hypertension) in as little as 14 days, while also possibly providing other significant health benefits. These other health benefits include reducing the risk of breast cancer after menopause and reducing the risk of type 2 diabetes, heart disease, colon cancer, and stroke. Health benefits also include weight loss and slowing kidney failure in patients with chronic kidney disease.   DIET GUIDELINES  · Limit salt (sodium). Your diet should contain less than 1500 mg of sodium daily.  · Limit refined or processed carbohydrates. Your diet should include mostly whole grains. Desserts and added sugars should be used sparingly.  · Include small amounts of heart-healthy fats. These types of fats include nuts, oils, and tub margarine. Limit saturated and trans fats. These fats have been shown to be harmful in the body.  CHOOSING FOODS   The following food groups are based on a 2000 calorie diet. See your Registered Dietitian for individual calorie needs.  Grains and Grain Products (6 to 8 servings daily)  · Eat More Often: Whole-wheat bread, Jagoda rice, whole-grain or wheat pasta, quinoa, popcorn without added fat or salt (air popped).  · Eat Less Often: White bread, white pasta, white rice, cornbread.  Vegetables (4 to 5 servings daily)  · Eat More Often: Fresh, frozen, and canned vegetables. Vegetables may be raw, steamed, roasted, or grilled with a minimal amount of fat.  · Eat Less Often/Avoid: Creamed or fried vegetables. Vegetables in a cheese sauce.  Fruit (4 to 5 servings daily)  · Eat More Often: All fresh, canned (in natural juice), or frozen fruits. Dried fruits without added sugar. One hundred percent fruit juice (½ cup [237 mL] daily).  · Eat Less Often: Dried fruits with added sugar. Canned fruit in light or heavy syrup.  Lean Meats, Fish, and Poultry (2  servings or less daily. One serving is 3 to 4 oz [85-114 g]).  · Eat More Often: Ninety percent or leaner ground beef, tenderloin, sirloin. Round cuts of beef, chicken breast, turkey breast. All fish. Grill, bake, or broil your meat. Nothing should be fried.  · Eat Less Often/Avoid: Fatty cuts of meat, turkey, or chicken leg, thigh, or wing. Fried cuts of meat or fish.  Dairy (2 to 3 servings)  · Eat More Often: Low-fat or fat-free milk, low-fat plain or light yogurt, reduced-fat or part-skim cheese.  · Eat Less Often/Avoid: Milk (whole, 2%). Whole milk yogurt. Full-fat cheeses.  Nuts, Seeds, and Legumes (4 to 5 servings per week)  · Eat More Often: All without added salt.  · Eat Less Often/Avoid: Salted nuts and seeds, canned beans with added salt.  Fats and Sweets (limited)  · Eat More Often: Vegetable oils, tub margarines without trans fats, sugar-free gelatin. Mayonnaise and salad dressings.  · Eat Less Often/Avoid: Coconut oils, palm oils, butter, stick margarine, cream, half and half, cookies, candy, pie.  FOR MORE INFORMATION  The Dash Diet Eating Plan: www.dashdiet.org  Document Released: 07/25/2011 Document Revised: 10/28/2011 Document Reviewed: 07/25/2011  ExitCare® Patient Information ©2014 [employer].

## 2013-11-25 NOTE — Progress Notes (Signed)
CC: Erica Bradley is a 53 y.o. female is here for Abdominal Pain   Subjective: HPI:  Followup of abdominal pain: She was seen last week for epigastric pain that came on acutely, metabolic panel, hemoglobin, H. pylori and lipase were all unremarkable. Epigastric pain is improving since she tells me she started Nexium and Carafate however also tells me that she has not picked up these medications yet.  She describes the pain only has pain and nonradiating. She was recommended to discuss these symptoms with her gastroenterologist however she has not done so yet. She denies nausea, vomiting, melena, fevers, chills but does endorse bright red blood in her stool since I saw her last, described as extremely mild only on toilet paper. She has a colonoscopy planned for later this month.  She complains of irritated spot on her left upper breast that has been present for the past one day described as a stinging sensation nothing particularly makes better or worse moderate in severity. No interventions as of yet.  She plans of weakness that has been present ever since the acute onset of her abdominal pain. She tells me it is not something that is able to localize, she denies focal weakness nor sensory disturbances. She has difficulty elaborating on the specifics of her weakness however it is worsened by standing for long periods of time which is one of the primary functions required for her job. Denies shortness of breath, chest pain, wheezing or irregular heartbeat  I brought up that her blood pressure has been in the hypertensive range at all visits with [place]. She tells me that she will in no way ever consider taking blood pressure medication because she knows her body better than a doctor would ever understand.       Review Of Systems Outlined In HPI  Past Medical History  Diagnosis Date  . Back complaints   . Hyperlipidemia     Past Surgical History  Procedure Laterality Date  . Abdominal  hysterectomy    . Cholecystectomy    . Tubal ligation  1997  . Eye surgery      x 3    Family History  Problem Relation Age of Onset  . Cancer Mother     lung ca  . Depression Mother   . Hyperlipidemia Mother   . Hypertension      History   Social History  . Marital Status: Divorced    Spouse Name: N/A    Number of Children: N/A  . Years of Education: N/A   Occupational History  . Not on file.   Social History Main Topics  . Smoking status: Current Every Day Smoker -- 0.25 packs/day    Types: Cigarettes  . Smokeless tobacco: Never Used     Comment: quit  ten years ago.  . Alcohol Use: Yes  . Drug Use: No  . Sexual Activity: Not on file   Other Topics Concern  . Not on file   Social History Narrative  . No narrative on file     Objective: BP 152/98  Pulse 90  Ht 5\' 5"  (1.651 m)  Wt 178 lb (80.74 kg)  BMI 29.62 kg/m2  General: Alert and Oriented, No Acute Distress HEENT: Pupils equal, round, reactive to light. Conjunctivae clear.   moist mucous membranes pharynx unremarkable  Lungs: Clear to auscultation bilaterally, no wheezing/ronchi/rales.  Comfortable work of breathing. Good air movement. Cardiac: Regular rate and rhythm. Normal S1/S2.  No murmurs, rubs, nor gallops.  Abdomen:  soft nontender without guarding or rebound tenderness. No palpable masses  Extremities: No peripheral edema.  Strong peripheral pulses. Full range of motion and strength of all upper extremities  Mental Status: No depression, anxiety, nor agitation. Skin: Warm and dry. Slightly inflamed seborrheic keratosis overlying left superior sternum 3 mm in diameter  Assessment & Plan: Kemisha was seen today for abdominal pain.  Diagnoses and associated orders for this visit:  Abdominal pain, epigastric  Dermatitis  Essential hypertension, benign  Weakness - Ambulatory referral to Physical Therapy    Abdominal pain: Epigastric: Uncontrolled, discussed that I support the  recommendation of her discussing her symptoms with her gastroenterologist for consideration of EGD, further support her following through with colonoscopy later this month given blood in stool and overdue followup needed for colon polyps. Dermatitis: Inflamed seborrheic keratosis, use triamcinolone that was prescribed last visit Essential hypertension: Discussed the importance of tight blood pressure control given her self-reported history of TIA, discussed DASH diet, she is adamant about never being on blood pressure medications Weakness: She declines further blood work such as TSH vitamin D, B12. She is agreeable to physical therapy which will hopefully uncover if weakness is psychological or physiological.   Return in about 4 weeks (around 12/23/2013).

## 2013-12-01 ENCOUNTER — Encounter: Payer: Self-pay | Admitting: *Deleted

## 2013-12-01 NOTE — Progress Notes (Addendum)
Patient ID: Erica Bradley, female   DOB: [birth year], 53 y.o.   MRN: [id number] Pt arrived for PV for colonoscopy scheduled 4/29 with Dr Deatra Ina. Pt says that she had 2 colonoscopy procedures 3 years apart for precancerous polyps.  I explained to pt that we would need to get those procedure reports from MD that preformed the procedure; she says she cannot remember where or who doctor was. She wanted the procedure done without getting the reports and said we should "just take her word for it" that she had procedures done.  She also is having problems with abdominal pain followed by Dr. Ileene Rubens; possibly needing EGD (according to Dr Hommel's office note).  I explained she would need OV to discuss previous colonoscopy procedures with Dr. Deatra Ina since she could not remember where or who did colonoscopy before we could schedule a colonoscopy or EGD.  Pt said that she did not want to have procedures here and she would find another GI doctor.  Colonoscopy scheduled for 4/29 cancelled.

## 2013-12-02 ENCOUNTER — Telehealth: Payer: Self-pay | Admitting: *Deleted

## 2013-12-02 DIAGNOSIS — R1013 Epigastric pain: Secondary | ICD-10-CM

## 2013-12-02 DIAGNOSIS — Z86010 Personal history of colonic polyps: Secondary | ICD-10-CM

## 2013-12-02 NOTE — Telephone Encounter (Signed)
Pt would like a referral to another GI doctor. ( see phone note from [place])

## 2013-12-02 NOTE — Telephone Encounter (Signed)
GI referral placed

## 2013-12-03 ENCOUNTER — Encounter: Payer: Self-pay | Admitting: Gastroenterology

## 2013-12-07 ENCOUNTER — Ambulatory Visit (INDEPENDENT_AMBULATORY_CARE_PROVIDER_SITE_OTHER): Payer: Commercial Managed Care - PPO | Admitting: Physical Therapy

## 2013-12-07 DIAGNOSIS — M6010 Interstitial myositis of unspecified site: Secondary | ICD-10-CM

## 2013-12-07 DIAGNOSIS — R5383 Other fatigue: Secondary | ICD-10-CM

## 2013-12-07 DIAGNOSIS — R5381 Other malaise: Secondary | ICD-10-CM

## 2013-12-09 ENCOUNTER — Encounter (INDEPENDENT_AMBULATORY_CARE_PROVIDER_SITE_OTHER): Payer: Commercial Managed Care - PPO

## 2013-12-09 DIAGNOSIS — M6281 Muscle weakness (generalized): Secondary | ICD-10-CM

## 2013-12-09 DIAGNOSIS — R5381 Other malaise: Secondary | ICD-10-CM

## 2013-12-09 DIAGNOSIS — R5383 Other fatigue: Secondary | ICD-10-CM

## 2013-12-14 ENCOUNTER — Encounter: Payer: Commercial Managed Care - PPO | Admitting: Physical Therapy

## 2013-12-15 ENCOUNTER — Encounter: Payer: Self-pay | Admitting: Gastroenterology

## 2013-12-16 ENCOUNTER — Encounter: Payer: Commercial Managed Care - PPO | Admitting: Physical Therapy

## 2013-12-21 ENCOUNTER — Encounter: Payer: Commercial Managed Care - PPO | Admitting: Physical Therapy

## 2013-12-21 ENCOUNTER — Ambulatory Visit (INDEPENDENT_AMBULATORY_CARE_PROVIDER_SITE_OTHER): Payer: Commercial Managed Care - PPO | Admitting: Family Medicine

## 2013-12-21 ENCOUNTER — Encounter: Payer: Self-pay | Admitting: Family Medicine

## 2013-12-21 VITALS — BP 134/91 | HR 88 | Wt 180.0 lb

## 2013-12-21 DIAGNOSIS — R29818 Other symptoms and signs involving the nervous system: Secondary | ICD-10-CM

## 2013-12-21 DIAGNOSIS — R29898 Other symptoms and signs involving the musculoskeletal system: Secondary | ICD-10-CM

## 2013-12-21 NOTE — Progress Notes (Signed)
CC: Erica Bradley is a 52 y.o. female is here for f/u after PT   Subjective: HPI:  Followup weakness: Since I saw her last she went to one visit of physical therapy where she was thought home exercise routine. She is doing leg lifts , core workouts, and increasing her walking regimen on a daily basis. She was requiring a cane up until last week for ambulation. She states she feels that she's somewhere between 50 and 75% back to her normal state of health. She is quite happy with her progress at home. She wants to know when she can anticipate going back to work. Denies focal sensory or motor disturbances currently.  Denies fatigue but does endorse muscular weakness which is her primary complaint. Denies fevers, chills, shortness of breath, wheezing, chest pain, limb claudication   Review Of Systems Outlined In HPI  Past Medical History  Diagnosis Date  . Back complaints   . Hyperlipidemia     Past Surgical History  Procedure Laterality Date  . Abdominal hysterectomy    . Cholecystectomy    . Tubal ligation  1997  . Eye surgery      x 3    Family History  Problem Relation Age of Onset  . Cancer Mother     lung ca  . Depression Mother   . Hyperlipidemia Mother   . Hypertension      History   Social History  . Marital Status: Divorced    Spouse Name: N/A    Number of Children: N/A  . Years of Education: N/A   Occupational History  . Not on file.   Social History Main Topics  . Smoking status: Current Every Day Smoker -- 0.25 packs/day    Types: Cigarettes  . Smokeless tobacco: Never Used     Comment: quit  ten years ago.  . Alcohol Use: Yes  . Drug Use: No  . Sexual Activity: Not on file   Other Topics Concern  . Not on file   Social History Narrative  . No narrative on file     Objective: BP 134/91  Pulse 88  Wt 180 lb (81.647 kg)  General: Alert and Oriented, No Acute Distress HEENT: Pupils equal, round, reactive to light. Conjunctivae clear.  E moist  mucous membranes pharynx unremarkable Lungs: Clear to auscultation bilaterally, no wheezing/ronchi/rales.  Comfortable work of breathing. Good air movement. Cardiac: Regular rate and rhythm. Normal S1/S2.  No murmurs, rubs, nor gallops.   Extremities: No peripheral edema.  Strong peripheral pulses. Full range of motion and strength of both lower extremities other than 4/5 left knee flexion Mental Status: No depression, anxiety, nor agitation. Skin: Warm and dry.  Assessment & Plan: Erica Bradley was seen today for f/u after pt.  Diagnoses and associated orders for this visit:  Muscular deconditioning    Muscular deconditioning: Discussed that I would like her to be at 75% improved or greater prior to going back to work anticipate this will be achieved by next Tuesday as long as she continues on her strength training and cardio work at home. She believes that she is ready to go back to work sooner than this day call me and I will adjust her work release.   Return if symptoms worsen or fail to improve.

## 2013-12-28 ENCOUNTER — Encounter: Payer: Commercial Managed Care - PPO | Admitting: Physical Therapy

## 2014-01-31 ENCOUNTER — Ambulatory Visit: Payer: Commercial Managed Care - PPO | Admitting: Gastroenterology

## 2015-07-31 ENCOUNTER — Ambulatory Visit: Payer: Self-pay | Admitting: Ophthalmology

## 2015-07-31 NOTE — H&P (Signed)
  Date of examination: 34- 6-16   Indication for surgery: to straighten the eyes and allow some binocularity  Pertinent past medical history:  Past Medical History  Diagnosis Date  . Back complaints   . 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 Cancer Mother     lung ca  . Depression Mother   . Hyperlipidemia Mother   . Hypertension      General:  Healthy appearing patient in no distress.    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

## 2015-08-04 ENCOUNTER — Ambulatory Visit (HOSPITAL_BASED_OUTPATIENT_CLINIC_OR_DEPARTMENT_OTHER): Payer: No Typology Code available for payment source | Admitting: Anesthesiology

## 2015-08-04 ENCOUNTER — Ambulatory Visit (HOSPITAL_BASED_OUTPATIENT_CLINIC_OR_DEPARTMENT_OTHER)
Admission: RE | Admit: 2015-08-04 | Discharge: 2015-08-04 | Disposition: A | Discharge status: Home / Self Care | Payer: No Typology Code available for payment source | Source: Ambulatory Visit | Attending: Ophthalmology | Admitting: Ophthalmology

## 2015-08-04 ENCOUNTER — Encounter (HOSPITAL_BASED_OUTPATIENT_CLINIC_OR_DEPARTMENT_OTHER)
Admission: RE | Disposition: A | Discharge status: Home / Self Care | Payer: Self-pay | Source: Ambulatory Visit | Attending: Ophthalmology

## 2015-08-04 ENCOUNTER — Encounter (HOSPITAL_BASED_OUTPATIENT_CLINIC_OR_DEPARTMENT_OTHER): Payer: Self-pay | Admitting: *Deleted

## 2015-08-04 DIAGNOSIS — Z79899 Other long term (current) drug therapy: Secondary | ICD-10-CM | POA: Diagnosis not present

## 2015-08-04 DIAGNOSIS — E785 Hyperlipidemia, unspecified: Secondary | ICD-10-CM | POA: Diagnosis not present

## 2015-08-04 DIAGNOSIS — H5010 Unspecified exotropia: Secondary | ICD-10-CM | POA: Diagnosis present

## 2015-08-04 DIAGNOSIS — F17210 Nicotine dependence, cigarettes, uncomplicated: Secondary | ICD-10-CM | POA: Insufficient documentation

## 2015-08-04 DIAGNOSIS — Z8673 Personal history of transient ischemic attack (TIA), and cerebral infarction without residual deficits: Secondary | ICD-10-CM | POA: Insufficient documentation

## 2015-08-04 DIAGNOSIS — F329 Major depressive disorder, single episode, unspecified: Secondary | ICD-10-CM | POA: Insufficient documentation

## 2015-08-04 DIAGNOSIS — K219 Gastro-esophageal reflux disease without esophagitis: Secondary | ICD-10-CM | POA: Diagnosis not present

## 2015-08-04 HISTORY — DX: Depression, unspecified: F32.A

## 2015-08-04 HISTORY — DX: Gastro-esophageal reflux disease without esophagitis: K21.9

## 2015-08-04 HISTORY — PX: STRABISMUS SURGERY: SHX218

## 2015-08-04 HISTORY — DX: Cerebral infarction, unspecified: I63.9

## 2015-08-04 HISTORY — DX: Reserved for inherently not codable concepts without codable children: IMO0001

## 2015-08-04 HISTORY — DX: Major depressive disorder, single episode, unspecified: F32.9

## 2015-08-04 SURGERY — STRABISMUS SURGERY, BILATERAL
Anesthesia: General | Site: Eye | Laterality: Bilateral

## 2015-08-04 MED ORDER — FENTANYL CITRATE (PF) 100 MCG/2ML IJ SOLN
INTRAMUSCULAR | Status: AC
  Filled 2015-08-04: qty 2

## 2015-08-04 MED ORDER — MIDAZOLAM HCL 2 MG/2ML IJ SOLN
1.0000 mg | INTRAMUSCULAR | Status: DC | PRN
  Administered 2015-08-04: 2 mg via INTRAVENOUS

## 2015-08-04 MED ORDER — DEXAMETHASONE SODIUM PHOSPHATE 4 MG/ML IJ SOLN
INTRAMUSCULAR | Status: DC | PRN
  Administered 2015-08-04: 10 mg via INTRAVENOUS

## 2015-08-04 MED ORDER — TOBRAMYCIN-DEXAMETHASONE 0.3-0.1 % OP OINT: 1.0000 "application " | TOPICAL_OINTMENT | Freq: Two times a day (BID) | OPHTHALMIC | Status: DC

## 2015-08-04 MED ORDER — BSS IO SOLN
INTRAOCULAR | Status: DC | PRN
  Administered 2015-08-04: 10 mL via INTRAOCULAR

## 2015-08-04 MED ORDER — ONDANSETRON HCL 4 MG/2ML IJ SOLN
INTRAMUSCULAR | Status: DC | PRN
  Administered 2015-08-04: 4 mg via INTRAVENOUS

## 2015-08-04 MED ORDER — HYDROMORPHONE HCL 1 MG/ML IJ SOLN: 0.2500 mg | INTRAMUSCULAR | Status: DC | PRN

## 2015-08-04 MED ORDER — TOBRAMYCIN-DEXAMETHASONE 0.3-0.1 % OP OINT
TOPICAL_OINTMENT | OPHTHALMIC | Status: DC | PRN
Start: 2015-08-04 — End: 2015-08-04
  Administered 2015-08-04: 1 via OPHTHALMIC

## 2015-08-04 MED ORDER — GLYCOPYRROLATE 0.2 MG/ML IJ SOLN: 0.2000 mg | Freq: Once | INTRAMUSCULAR | Status: DC | PRN

## 2015-08-04 MED ORDER — KETOROLAC TROMETHAMINE 30 MG/ML IJ SOLN
INTRAMUSCULAR | Status: DC | PRN
  Administered 2015-08-04: 30 mg via INTRAVENOUS

## 2015-08-04 MED ORDER — DEXAMETHASONE SODIUM PHOSPHATE 10 MG/ML IJ SOLN
INTRAMUSCULAR | Status: AC
  Filled 2015-08-04: qty 1

## 2015-08-04 MED ORDER — FENTANYL CITRATE (PF) 100 MCG/2ML IJ SOLN
50.0000 ug | INTRAMUSCULAR | Status: DC | PRN
  Administered 2015-08-04: 100 ug via INTRAVENOUS
  Administered 2015-08-04: 25 ug via INTRAVENOUS

## 2015-08-04 MED ORDER — TOBRAMYCIN-DEXAMETHASONE 0.3-0.1 % OP SUSP
OPHTHALMIC | Status: AC
  Filled 2015-08-04: qty 2.5

## 2015-08-04 MED ORDER — ATROPINE SULFATE 0.4 MG/ML IJ SOLN
INTRAMUSCULAR | Status: DC | PRN
Start: 2015-08-04 — End: 2015-08-04
  Administered 2015-08-04: .4 mg via INTRAVENOUS

## 2015-08-04 MED ORDER — MIDAZOLAM HCL 2 MG/2ML IJ SOLN
INTRAMUSCULAR | Status: AC
  Filled 2015-08-04: qty 2

## 2015-08-04 MED ORDER — ONDANSETRON HCL 4 MG/2ML IJ SOLN
INTRAMUSCULAR | Status: AC
  Filled 2015-08-04: qty 2

## 2015-08-04 MED ORDER — KETOROLAC TROMETHAMINE 30 MG/ML IJ SOLN
INTRAMUSCULAR | Status: AC
  Filled 2015-08-04: qty 1

## 2015-08-04 MED ORDER — LIDOCAINE HCL (CARDIAC) 20 MG/ML IV SOLN
INTRAVENOUS | Status: DC | PRN
  Administered 2015-08-04: 60 mg via INTRAVENOUS

## 2015-08-04 MED ORDER — LIDOCAINE HCL (CARDIAC) 20 MG/ML IV SOLN
INTRAVENOUS | Status: AC
  Filled 2015-08-04: qty 5

## 2015-08-04 MED ORDER — PROPOFOL 10 MG/ML IV BOLUS
INTRAVENOUS | Status: DC | PRN
  Administered 2015-08-04: 200 mg via INTRAVENOUS

## 2015-08-04 MED ORDER — LACTATED RINGERS IV SOLN
INTRAVENOUS | Status: DC
  Administered 2015-08-04: 10:00:00 via INTRAVENOUS

## 2015-08-04 MED ORDER — SCOPOLAMINE 1 MG/3DAYS TD PT72: 1.0000 | MEDICATED_PATCH | Freq: Once | TRANSDERMAL | Status: DC | PRN

## 2015-08-04 SURGICAL SUPPLY — 31 items
APL SRG 3 HI ABS STRL LF PLS (MISCELLANEOUS) ×1
APPLICATOR COTTON TIP 6IN STRL (MISCELLANEOUS) ×8 IMPLANT
APPLICATOR DR MATTHEWS STRL (MISCELLANEOUS) ×2 IMPLANT
BANDAGE EYE OVAL (MISCELLANEOUS) ×2 IMPLANT
CAUTERY EYE LOW TEMP 1300F FIN (OPHTHALMIC RELATED) IMPLANT
COVER BACK TABLE 60X90IN (DRAPES) ×2 IMPLANT
COVER MAYO STAND STRL (DRAPES) ×2 IMPLANT
DRAPE SURG 17X23 STRL (DRAPES) ×4 IMPLANT
DRAPE U-SHAPE 76X120 STRL (DRAPES) ×1 IMPLANT
GLOVE BIO SURGEON STRL SZ 6.5 (GLOVE) ×2 IMPLANT
GLOVE BIO SURGEON STRL SZ7.5 (GLOVE) ×1 IMPLANT
GLOVE BIOGEL M STRL SZ7.5 (GLOVE) ×4 IMPLANT
GOWN STRL REUS W/ TWL LRG LVL3 (GOWN DISPOSABLE) ×1 IMPLANT
GOWN STRL REUS W/TWL LRG LVL3 (GOWN DISPOSABLE) ×2
GOWN STRL REUS W/TWL XL LVL3 (GOWN DISPOSABLE) ×3 IMPLANT
NS IRRIG 1000ML POUR BTL (IV SOLUTION) ×2 IMPLANT
PACK BASIN DAY SURGERY FS (CUSTOM PROCEDURE TRAY) ×2 IMPLANT
SHEET MEDIUM DRAPE 40X70 STRL (DRAPES) IMPLANT
SLEEVE SCD COMPRESS KNEE MED (MISCELLANEOUS) ×2 IMPLANT
SPEAR EYE SURG WECK-CEL (MISCELLANEOUS) ×4 IMPLANT
STRIP CLOSURE SKIN 1/4X4 (GAUZE/BANDAGES/DRESSINGS) ×1 IMPLANT
SUT 6 0 SILK T G140 8DA (SUTURE) ×1 IMPLANT
SUT MERSILENE 5-0 (SUTURE) IMPLANT
SUT PLAIN 6 0 TG1408 (SUTURE) ×1 IMPLANT
SUT SILK 4 0 C 3 735G (SUTURE) IMPLANT
SUT VICRYL 6 0 S 28 (SUTURE) ×1 IMPLANT
SUT VICRYL ABS 6-0 S29 18IN (SUTURE) ×3 IMPLANT
SYR TB 1ML LL NO SAFETY (SYRINGE) ×2 IMPLANT
SYRINGE 10CC LL (SYRINGE) ×2 IMPLANT
TOWEL OR 17X24 6PK STRL BLUE (TOWEL DISPOSABLE) ×2 IMPLANT
TRAY DSU PREP LF (CUSTOM PROCEDURE TRAY) ×2 IMPLANT

## 2015-08-04 NOTE — Anesthesia Procedure Notes (Signed)
Procedure Name: LMA Insertion Performed by: Terrance Mass Pre-anesthesia Checklist: Patient identified, Emergency Drugs available, Suction available and Patient being monitored Patient Re-evaluated:Patient Re-evaluated prior to inductionOxygen Delivery Method: Circle System Utilized Preoxygenation: Pre-oxygenation with 100% oxygen Intubation Type: IV induction Ventilation: Mask ventilation without difficulty LMA: LMA flexible inserted LMA Size: 4.0 Number of attempts: 2 Airway Equipment and Method: Bite block Placement Confirmation: positive ETCO2 Tube secured with: Tape Dental Injury: Teeth and Oropharynx as per pre-operative assessment

## 2015-08-04 NOTE — Anesthesia Postprocedure Evaluation (Signed)
Anesthesia Post Note  Patient: Erica Bradley  Procedure(s) Performed: Procedure(s) (LRB): REPAIR STRABISMUS BILATERAL (Bilateral)  Patient location during evaluation: PACU Anesthesia Type: General Level of consciousness: awake and alert Pain management: pain level controlled Vital Signs Assessment: post-procedure vital signs reviewed and stable Respiratory status: spontaneous breathing, nonlabored ventilation and respiratory function stable Cardiovascular status: blood pressure returned to baseline and stable Postop Assessment: no signs of nausea or vomiting Anesthetic complications: no    Last Vitals:  Filed Vitals:   08/04/15 1300 08/04/15 1315  BP: 125/84 134/79  Pulse: 102 97  Temp:    Resp: 26 15    Last Pain:  Filed Vitals:   08/04/15 1351  PainSc: 1                  Rosalba Totty,W. EDMOND

## 2015-08-04 NOTE — Discharge Instructions (Signed)
Diet: Clear liquids, advance to soft foods then regular diet as tolerated.  Pain control:  Post Anesthesia Home Care Instructions  Activity: Get plenty of rest for the remainder of the day. A responsible adult should stay with you for 24 hours following the procedure.  For the next 24 hours, DO NOT: -Drive a car -Paediatric nurse -Drink alcoholic beverages -Take any medication unless instructed by your physician -Make any legal decisions or sign important papers.  Meals: Start with liquid foods such as gelatin or soup. Progress to regular foods as tolerated. Avoid greasy, spicy, heavy foods. If nausea and/or vomiting occur, drink only clear liquids until the nausea and/or vomiting subsides. Call your physician if vomiting continues.  Special Instructions/Symptoms: Your throat may feel dry or sore from the anesthesia or the breathing tube placed in your throat during surgery. If this causes discomfort, gargle with warm salt water. The discomfort should disappear within 24 hours.  If you had a scopolamine patch placed behind your ear for the management of post- operative nausea and/or vomiting:  1. The medication in the patch is effective for 72 hours, after which it should be removed.  Wrap patch in a tissue and discard in the trash. Wash hands thoroughly with soap and water. 2. You may remove the patch earlier than 72 hours if you experience unpleasant side effects which may include dry mouth, dizziness or visual disturbances. 3. Avoid touching the patch. Wash your hands with soap and water after contact with the patch.     1)  Ibuprofen 600 mg by mouth every 6-8 hours as needed for pain  2)  Ice pack/cold compress to operated eye(s) ad desired  Eye medications:  Tobradex eye ointment, one half inch in each eye twice a day for 1 week  Activity: No swimming for 1 week.  It is OK to let water run over the face and eyes while showering or taking a bath, even during the first week.  No  other restriction on exercise or activity.  Call Dr. Janee Morn office [phone] with any problems or concerns.

## 2015-08-04 NOTE — H&P (View-Only) (Signed)
  Date of examination: 61- 6-16   Indication for surgery: to straighten the eyes and allow some binocularity  Pertinent past medical history:  Past Medical History  Diagnosis Date  . Back complaints   . 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 Cancer Mother     lung ca  . Depression Mother   . Hyperlipidemia Mother   . Hypertension      General:  Healthy appearing patient in no distress.    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

## 2015-08-04 NOTE — Anesthesia Preprocedure Evaluation (Addendum)
Anesthesia Evaluation  Patient identified by MRN, date of birth, ID band Patient awake    Reviewed: Allergy & Precautions, H&P , NPO status , Patient's Chart, lab work & pertinent test results  Airway Mallampati: II  TM Distance: >3 FB Neck ROM: Full    Dental no notable dental hx. (+) Teeth Intact, Dental Advisory Given   Pulmonary Current Smoker,    Pulmonary exam normal breath sounds clear to auscultation       Cardiovascular negative cardio ROS   Rhythm:Regular Rate:Normal     Neuro/Psych Depression CVA, No Residual Symptoms    GI/Hepatic Neg liver ROS, GERD  Controlled and Medicated,  Endo/Other  negative endocrine ROS  Renal/GU negative Renal ROS  negative genitourinary   Musculoskeletal   Abdominal   Peds  Hematology negative hematology ROS (+)   Anesthesia Other Findings   Reproductive/Obstetrics negative OB ROS                            Anesthesia Physical Anesthesia Plan  ASA: II  Anesthesia Plan: General   Post-op Pain Management:    Induction: Intravenous  Airway Management Planned: LMA  Additional Equipment:   Intra-op Plan:   Post-operative Plan: Extubation in OR  Informed Consent: I have reviewed the patients History and Physical, chart, labs and discussed the procedure including the risks, benefits and alternatives for the proposed anesthesia with the patient or authorized representative who has indicated his/her understanding and acceptance.   Dental advisory given  Plan Discussed with: CRNA  Anesthesia Plan Comments:         Anesthesia Quick Evaluation

## 2015-08-04 NOTE — Interval H&P Note (Signed)
History and Physical Interval Note:  08/04/2015 10:37 AM  Erica Bradley  has presented today for surgery, with the diagnosis of EXOTROPHIA  The various methods of treatment have been discussed with the patient and family. After consideration of risks, benefits and other options for treatment, the patient has consented to  Procedure(s): REPAIR STRABISMUS BILATERAL (Bilateral) as a surgical intervention .  The patient's history has been reviewed, patient examined, no change in status, stable for surgery.  I have reviewed the patient's chart and labs.  Questions were answered to the patient's satisfaction.     Derry Skill

## 2015-08-04 NOTE — Transfer of Care (Signed)
Immediate Anesthesia Transfer of Care Note  Patient: Erica Bradley  Procedure(s) Performed: Procedure(s): REPAIR STRABISMUS BILATERAL (Bilateral)  Patient Location: PACU  Anesthesia Type:General  Level of Consciousness: awake and sedated  Airway & Oxygen Therapy: Patient Spontanous Breathing and Patient connected to face mask oxygen  Post-op Assessment: Report given to RN and Post -op Vital signs reviewed and stable  Post vital signs: Reviewed and stable  Last Vitals:  Filed Vitals:   08/04/15 0941  BP: 130/91  Pulse: 97  Temp: 36.5 C  Resp: 97    Complications: No apparent anesthesia complications

## 2015-08-04 NOTE — Op Note (Signed)
08/04/2015  12:19 PM  PATIENT:  Erica Bradley    PRE-OPERATIVE DIAGNOSIS:  1. 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 History of previous eye muscle surgery x 3, details unknown  POST-OPERATIVE DIAGNOSIS:  same  PROCEDURE:  1.  Exploration of left medial rectus muscle and both lateral rectus muscles   2.  Lateral rectus muscle recession, 10.0 mm OD, 9.0 mm OS   3.  Left medial rectus muscle resection 6.0 mm  SURGEON:  Derry Skill, MD  ANESTHESIA:   General  COMPLICATIONS: none  OPERATIVE PROCEDURE: After routine preoperative evaluation including informed consent, the patient was taken to the operating room where she was identified by me. Gen. Anesthesia was induced without difficulty after placement of appropriate monitors. The patient was prepped and draped in standard sterile fashion. A lid speculum was placed in the left eye.  There is no significant limitation to forced abduction of the left eye. Through an inferotemporal fornix incision through conjunctiva and Tenon's fascia left lateral rectus muscle was engaged on a series of muscle hooks and cleared of its fascial attachments. No scar tissue suggestive of previous surgery was encountered. The tendon was secured with a double-armed 6-0 Vicryl suture, with a double locking bite at each border of the muscle, 1 mm from the insertion. The muscle was disinserted. It was not reattached to sclera yet, pending evaluation of the medial rectus muscle.  Through an inferonasal fornix incision through conjunctiva and Tenon's fascia, the left medial rectus muscle was engaged on a series of muscle hooks and cleared of its surrounding fascial attachments and scar tissue. The scar tissue was consistent with previous surgery. However, the muscle was found inserted approximately 5 mm posterior to the limbus, suggesting that if it had had previous surgery, it must resection. The muscle was spread between 2 self-retaining hooks. A 2 mm bite was  taken of the center of the muscle belly at a measured distance of 6 mm posterior to the insertion. A knot was tied securely at this location. The needle at each end of the double-armed suture was passed from the center of the muscle belly to the periphery, well to and 6.0 mm posterior to the insertion. A locking bite was placed at each border of the muscle. A resection clamp was placed on the muscle just anterior to the sutures. The muscle was disinserted. Each pole suture was passed posteriorly to anteriorly through the corresponding end of the muscle stump, then anteriorly to posteriorly near the center of the stump, then posteriorly to anteriorly through the center of the muscle belly, just posterior to the previously placed knot. The muscle was drawn up to the level of the original insertion. The clamp was removed. The suture ends were tied securely. The portion of the muscle anterior to the sutures was carefully excised. Conjunctiva was closed with a single 6-0 plain gut suture.  The speculum was transferred to the right eye.. Note that there was again no evidence of previous surgery on the right eye. It was now felt that Correction of the strabismus could be accomplished with 3 muscles instead of the originally planned for. The right lateral rectus muscle was isolated and secured and disinserted, just as described for the left lateral rectus muscle. The muscle was reattached to sclera at a measured distance of 10.0 mm posterior to the original insertion, using direct scleral passes in crossed swords fashion. The suture ends were tied securely after the position of the muscle been  checked and found to be accurate. Conjunctiva was closed with 2 6-0 plain gut sutures. Attention was then redirected to the left lateral rectus muscle, which was recessed 9.0 mm just as described for the right lateral rectus muscle.  Tobradex ophthalmic ointment was placed in each eye. The patient was awakened without difficulty  and taken to the recovery room in stable condition, having suffered no intraoperative or immediate postoperative Complications.  Derry Skill, MD

## 2015-08-07 ENCOUNTER — Encounter (HOSPITAL_BASED_OUTPATIENT_CLINIC_OR_DEPARTMENT_OTHER): Payer: Self-pay | Admitting: Ophthalmology

## 2015-10-01 DIAGNOSIS — I700 Atherosclerosis of aorta: Secondary | ICD-10-CM | POA: Insufficient documentation

## 2015-10-01 DIAGNOSIS — N183 Chronic kidney disease, stage 3 unspecified: Secondary | ICD-10-CM | POA: Insufficient documentation

## 2015-10-01 DIAGNOSIS — Z87891 Personal history of nicotine dependence: Secondary | ICD-10-CM | POA: Insufficient documentation

## 2015-11-09 DIAGNOSIS — F419 Anxiety disorder, unspecified: Secondary | ICD-10-CM | POA: Insufficient documentation

## 2015-11-20 DIAGNOSIS — Z6830 Body mass index (BMI) 30.0-30.9, adult: Secondary | ICD-10-CM | POA: Insufficient documentation

## 2015-12-01 DIAGNOSIS — N200 Calculus of kidney: Secondary | ICD-10-CM | POA: Insufficient documentation

## 2016-02-06 ENCOUNTER — Ambulatory Visit (INDEPENDENT_AMBULATORY_CARE_PROVIDER_SITE_OTHER): Payer: BLUE CROSS/BLUE SHIELD | Admitting: Family Medicine

## 2016-02-06 VITALS — BP 114/70 | HR 105 | Temp 98.1°F | Resp 18 | Ht 65.0 in | Wt 181.0 lb

## 2016-02-06 DIAGNOSIS — L03032 Cellulitis of left toe: Secondary | ICD-10-CM | POA: Diagnosis not present

## 2016-02-06 MED ORDER — DOXYCYCLINE HYCLATE 100 MG PO TABS: 100.0000 mg | ORAL_TABLET | Freq: Two times a day (BID) | ORAL | Status: DC

## 2016-02-06 NOTE — Patient Instructions (Addendum)
     IF you received an x-ray today, you will receive an invoice from [facility]. Please contact [facility] at [phone] with questions or concerns regarding your invoice.   IF you received labwork today, you will receive an invoice from [employer]. Please contact [facility] at [phone] with questions or concerns regarding your invoice.   Our billing staff will not be able to assist you with questions regarding bills from these companies.  You will be contacted with the lab results as soon as they are available. The fastest way to get your results is to activate your My Chart account. Instructions are located on the last page of this paperwork. If you have not heard from [place] regarding the results in 2 weeks, please contact this office.     Cellulitis Cellulitis is an infection of the skin and the tissue beneath it. The infected area is usually red and tender. Cellulitis occurs most often in the arms and lower legs.  CAUSES  Cellulitis is caused by bacteria that enter the skin through cracks or cuts in the skin. The most common types of bacteria that cause cellulitis are staphylococci and streptococci. SIGNS AND SYMPTOMS   Redness and warmth.  Swelling.  Tenderness or pain.  Fever. DIAGNOSIS  Your health care provider can usually determine what is wrong based on a physical exam. Blood tests may also be done. TREATMENT  Treatment usually involves taking an antibiotic medicine. HOME CARE INSTRUCTIONS   Take your antibiotic medicine as directed by your health care provider. Finish the antibiotic even if you start to feel better.  Keep the infected arm or leg elevated to reduce swelling.  Apply a warm cloth to the affected area up to 4 times per day to relieve pain.  Take medicines only as directed by your health care provider.  Keep all follow-up visits as directed by your health care provider. SEEK MEDICAL CARE IF:   You  notice red streaks coming from the infected area.  Your red area gets larger or turns dark in color.  Your bone or joint underneath the infected area becomes painful after the skin has healed.  Your infection returns in the same area or another area.  You notice a swollen bump in the infected area.  You develop new symptoms.  You have a fever. SEEK IMMEDIATE MEDICAL CARE IF:   You feel very sleepy.  You develop vomiting or diarrhea.  You have a general ill feeling (malaise) with muscle aches and pains.   This information is not intended to replace advice given to you by your health care provider. Make sure you discuss any questions you have with your health care provider.   Document Released: 05/15/2005 Document Revised: 04/26/2015 Document Reviewed: 10/21/2011 Elsevier Interactive Patient Education [employer].

## 2016-02-06 NOTE — Progress Notes (Signed)
55 yo woman with toenail problem.  Left dorsal great toe had blister 13 days ago, pus drained about 11 days ago and the pain is returning.  Pus was clear and then yellow.   Objective: BP 114/70 mmHg  Pulse 105  Temp(Src) 98.1 F (36.7 C) (Oral)  Resp 18  Ht 5\' 5"  (1.651 m)  Wt 181 lb (82.101 kg)  BMI 30.12 kg/m2  SpO2 98% The dorsal DIP joint of the left great toe is violaceous where the blister was, measuring 1/2 x 1 cm with a peeling circumference and some faint erythema.  Assessment:  Cellulitis of toe of left foot - Plan: doxycycline (VIBRA-TABS) 100 MG tablet  Robyn Haber, MD

## 2016-04-05 ENCOUNTER — Ambulatory Visit (INDEPENDENT_AMBULATORY_CARE_PROVIDER_SITE_OTHER): Payer: Self-pay | Admitting: Urgent Care

## 2016-04-05 ENCOUNTER — Ambulatory Visit (INDEPENDENT_AMBULATORY_CARE_PROVIDER_SITE_OTHER): Payer: Self-pay

## 2016-04-05 VITALS — BP 135/86 | HR 105 | Temp 98.5°F | Resp 18 | Ht 65.0 in | Wt 171.0 lb

## 2016-04-05 DIAGNOSIS — S6992XA Unspecified injury of left wrist, hand and finger(s), initial encounter: Secondary | ICD-10-CM

## 2016-04-05 DIAGNOSIS — R2232 Localized swelling, mass and lump, left upper limb: Secondary | ICD-10-CM

## 2016-04-05 DIAGNOSIS — M79642 Pain in left hand: Secondary | ICD-10-CM

## 2016-04-05 MED ORDER — NAPROXEN SODIUM 550 MG PO TABS: 550.0000 mg | ORAL_TABLET | Freq: Two times a day (BID) | ORAL | 1 refills | Status: DC

## 2016-04-05 NOTE — Patient Instructions (Addendum)
Scaphoid Fracture, Wrist A fracture is a break in the bone. The bone you have broken often does not show up as a fracture on x-ray until later on in the healing phase. This bone is called the scaphoid bone. With this bone, your caregiver will often cast or splint your wrist as though it is fractured, even if a fracture is not seen on the x-ray. This is often done with wrist injuries in which there is tenderness at the base of the thumb. An x-ray at 1-3 weeks after your injury may confirm this fracture. A cast or splint is used to protect and keep your injured bone in good position for healing. The cast or splint will be on generally for about 6 to 16 weeks, depending on your health, age, the fracture location and how quickly you heal. Another name for the scaphoid bone is the navicular bone. HOME CARE INSTRUCTIONS   To lessen the swelling and pain, keep the injured part elevated above your heart while sitting or lying down.  Apply ice to the injury for 15-20 minutes, 03-04 times per day while awake, for 2 days. Put the ice in a plastic bag and place a thin towel between the bag of ice and your cast.  If you have a plaster or fiberglass cast or splint:  Do not try to scratch the skin under the cast using sharp or pointed objects.  Check the skin around the cast every day. You may put lotion on any red or sore areas.  Keep your cast or splint dry and clean.  If you have a plaster splint:  Wear the splint as directed.  You may loosen the elastic bandage around the splint if your fingers become numb, tingle, or turn cold or blue.  If you have been put in a removable splint, wear and use as directed.  Do not put pressure on any part of your cast or splint; it may deform or break. Rest your cast or splint only on a pillow the first 24 hours until it is fully hardened.  Your cast or splint can be protected during bathing with a plastic bag. Do not lower the cast or splint into water.  Only take  over-the-counter or prescription medicines for pain, discomfort, or fever as directed by your caregiver.  If your caregiver has given you a follow up appointment, it is very important to keep that appointment. Not keeping the appointment could result in chronic pain and decreased function. If there is any problem keeping the appointment, you must call back to this facility for assistance. SEEK IMMEDIATE MEDICAL CARE IF:   Your cast gets damaged, wet or breaks.  You have continued severe pain or more swelling than you did before the cast or splint was put on.  Your skin or nails below the injury turn blue or gray, or feel cold or numb.  You have tingling or burning pain in your fingers or increasing pain with movement of your fingers   This information is not intended to replace advice given to you by your health care provider. Make sure you discuss any questions you have with your health care provider.   Document Released: 07/26/2002 Document Revised: 10/28/2011 Document Reviewed: 02/15/2015 Elsevier Interactive Patient Education 2016 [employer].     IF you received an x-ray today, you will receive an invoice from [facility]. Please contact [facility] at [phone] with questions or concerns regarding your invoice.   IF you received labwork today, you  will receive an invoice from [employer]. Please contact [facility] at [phone] with questions or concerns regarding your invoice.   Our billing staff will not be able to assist you with questions regarding bills from these companies.  You will be contacted with the lab results as soon as they are available. The fastest way to get your results is to activate your My Chart account. Instructions are located on the last page of this paperwork. If you have not heard from [place] regarding the results in 2 weeks, please contact this office.     We recommend that you schedule a mammogram for  breast cancer screening. Typically, you do not need a referral to do this. Please contact a local imaging center to schedule your mammogram.  [facility] - [phone]  *ask for the Radiology Department [facility] - [phone] or [phone]  [facility] - [phone] [place] [phone] [facility] - [phone]  *ask for the [facility] - [phone]  *ask for the Radiology Department [facility] - [phone]  *ask for the [facility] - [phone]

## 2016-04-05 NOTE — Progress Notes (Signed)
    MRN: [id number] DOB: 03-23-1961  Subjective:   Erica Bradley is a 55 y.o. female presenting for chief complaint of Wrist Pain (left wrist pain, fell about 10am this morning )  Reports onset of left wrist today s/p fall this morning. She was outside working in the yard, slipped and fell breaking her fall with her left hand. She has since had worsening swelling of her wrist and hand, sharp pain, rated 0000000, worse with certain movements. Denies hearing popping noises, bony deformity, numbness or tingling, bruising.   Erica Bradley has a current medication list which includes the following prescription(s): escitalopram and lamotrigine. Also is allergic to sulfur.  Erica Bradley  has a past medical history of Back complaints; Depression; GERD (gastroesophageal reflux disease); Hyperlipidemia; Shortness of breath dyspnea; and Stroke ([place]) (2005). Also  has a past surgical history that includes Abdominal hysterectomy; Cholecystectomy; Tubal ligation (1997); Eye surgery; and Strabismus surgery (Bilateral, 08/04/2015).  Objective:   Vitals: BP 135/86 (BP Location: Left Arm, Patient Position: Sitting, Cuff Size: Normal)   Pulse (!) 105   Temp 98.5 F (36.9 C) (Oral)   Resp 18   Ht 5\' 5"  (1.651 m)   Wt 171 lb (77.6 kg)   SpO2 99%   BMI 28.46 kg/m   Physical Exam  Constitutional: She is oriented to person, place, and time. She appears well-developed and well-nourished.  Cardiovascular: Normal rate.   Pulmonary/Chest: Effort normal.  Musculoskeletal:       Left wrist: She exhibits decreased range of motion (flexion, extension), bony tenderness (ulnar aspect of wrist but not over the styloid) and swelling. She exhibits no effusion, no crepitus, no deformity and no laceration.       Left hand: She exhibits decreased range of motion (flexion), tenderness (over snuff box with associated swelling) and swelling. She exhibits no bony tenderness, normal two-point discrimination, normal capillary refill, no deformity  and no laceration. Normal sensation noted. Normal strength noted.  Neurological: She is alert and oriented to person, place, and time.   Dg Wrist Complete Left  Result Date: 04/05/2016 CLINICAL DATA:  Fall with left hand injury.  Pain and swelling. EXAM: LEFT WRIST - COMPLETE 3+ VIEW COMPARISON:  None. FINDINGS: No fracture.  No dislocation. Mild joint space narrowing with small marginal osteophytes at the trapezium first metacarpal articulation consistent with osteoarthritis. No other arthropathic change. Mild soft tissue swelling is evident, most prominent dorsally. IMPRESSION: 1. No fracture or dislocation. Electronically Signed   By: Lajean Manes M.D.   On: 04/05/2016 16:30   Dg Hand Complete Left  Result Date: 04/05/2016 CLINICAL DATA:  Left hand injury following fall, initial encounter EXAM: LEFT HAND - COMPLETE 3+ VIEW COMPARISON:  None. FINDINGS: No acute fracture or dislocation is seen. No gross soft tissue abnormality is noted. Mild degenerative changes are noted at the first [facility] joint. IMPRESSION: No acute abnormality noted. Electronically Signed   By: Inez Catalina M.D.   On: 04/05/2016 16:28   Assessment and Plan :   1. Hand injury, left, initial encounter 2. Left hand pain 3. Localized swelling on left hand - Discussed options for splints given both her wrist/forearm pain and snuff box tenderness. We agreed to do a sugar tong splint and have her limit thumb use so as to keep it as immobilized as possible. RTC in 1 week for repeat x-rays. Use Anaprox for pain and inflammation.  [name], PA-C [facility] [phone] 04/05/2016 4:06 PM

## 2016-04-15 ENCOUNTER — Telehealth: Payer: Self-pay | Admitting: *Deleted

## 2016-04-15 ENCOUNTER — Ambulatory Visit (INDEPENDENT_AMBULATORY_CARE_PROVIDER_SITE_OTHER): Payer: Self-pay | Admitting: Urgent Care

## 2016-04-15 ENCOUNTER — Ambulatory Visit (INDEPENDENT_AMBULATORY_CARE_PROVIDER_SITE_OTHER): Payer: Self-pay

## 2016-04-15 VITALS — BP 142/84 | HR 84 | Temp 98.0°F | Resp 16 | Ht 65.0 in | Wt 169.8 lb

## 2016-04-15 DIAGNOSIS — W19XXXD Unspecified fall, subsequent encounter: Secondary | ICD-10-CM

## 2016-04-15 DIAGNOSIS — M25532 Pain in left wrist: Secondary | ICD-10-CM

## 2016-04-15 DIAGNOSIS — M7989 Other specified soft tissue disorders: Secondary | ICD-10-CM

## 2016-04-15 DIAGNOSIS — Z634 Disappearance and death of family member: Secondary | ICD-10-CM

## 2016-04-15 DIAGNOSIS — M79642 Pain in left hand: Secondary | ICD-10-CM

## 2016-04-15 NOTE — Telephone Encounter (Signed)
Patient had a left thumb spica place on wrist and I completed the forms for [employer]. I was explaining the patient pink copies to the patient after she signed the forms, I offered her copies of pink forms and declined and balled the forms up and put them on the treatment. The patient was escorted to checkout because she was self-pay, then the patient was copies to see how much thumb spica cost. I advised Guerry Bruin about the situation and [place] [employer] scientist) spoke to the patient and the same issue stated she did not want the copy. [place]ia retreive the original pink copies of forms and mailed them to the patient for her records.

## 2016-04-15 NOTE — Progress Notes (Signed)
    MRN: [id number] DOB: 05-Sep-1960  Subjective:   Erica Bradley is a 55 y.o. female presenting for follow up on left wrist and forearm pain s/p fall.   Patient reports significant improvement in her wrist and hand pain. She still has some swelling from her wrist up to her fingers. She has been using Anaprox, kept her wrist in a splint. Denies re-injuring her hand, wrist, trauma, falls.   Erica Bradley has a current medication list which includes the following prescription(s): escitalopram, lamotrigine, and naproxen sodium. Also is allergic to sulfur.  Erica Bradley  has a past medical history of Back complaints; Depression; GERD (gastroesophageal reflux disease); Hyperlipidemia; Shortness of breath dyspnea; and Stroke ([place]) (2005). Also  has a past surgical history that includes Abdominal hysterectomy; Cholecystectomy; Tubal ligation (1997); Eye surgery; and Strabismus surgery (Bilateral, 08/04/2015).  Objective:   Vitals: BP (!) 142/84 (BP Location: Right Arm, Patient Position: Sitting, Cuff Size: Normal)   Pulse 84   Temp 98 F (36.7 C) (Oral)   Resp 16   Ht 5\' 5"  (1.651 m)   Wt 169 lb 12.8 oz (77 kg)   SpO2 98%   BMI 28.26 kg/m   Physical Exam  Constitutional: She is oriented to person, place, and time. She appears well-developed and well-nourished.  Cardiovascular: Normal rate.   Pulmonary/Chest: Effort normal.  Musculoskeletal:       Left hand: She exhibits decreased range of motion (flexion of fingers), tenderness (over snuff box, radial and ulnar aspect of wrist), bony tenderness (snuff box) and swelling. She exhibits normal capillary refill, no deformity and no laceration. Normal sensation noted. Decreased strength (secondary to pain per patient) noted.  Neurological: She is alert and oriented to person, place, and time.   Dg Wrist Complete Left  Result Date: 04/15/2016 CLINICAL DATA:  Wrist pain and swelling after a fall 10 days ago. EXAM: LEFT WRIST - COMPLETE 3+ VIEW COMPARISON:   Radiographs 04/05/2016 FINDINGS: Stable moderate degenerative changes at the carpometacarpal joint of thumb. Stable degenerative changes at the scaphoid articulation with the trapezium and trapezoid bones. The intercarpal and radiocarpal joint spaces are maintained. No scaphoid fracture is identified. The other carpal bones are also intact. The distal radius and ulna are intact. IMPRESSION: Stable degenerative changes involving the radial aspect of the carpus. No acute fracture. Electronically Signed   By: Marijo Sanes M.D.   On: 04/15/2016 15:30    Assessment and Plan :   1. Left wrist pain 2. Fall, subsequent encounter 3. Left hand pain 4. Swelling of left hand - Urgent referral to ortho for further evaluation of possible occult fracture. In the meantime, continue current regimen, wear wrist splint.   5. Bereavement - Offered help with medical therapy if she decides she needs it.  [name], PA-C [facility] [phone] 04/15/2016 3:05 PM

## 2016-04-15 NOTE — Addendum Note (Signed)
Addended by: [name] on: 04/15/2016 03:55 PM   Modules accepted: Orders

## 2016-04-15 NOTE — Patient Instructions (Addendum)
Wrist Pain There are many things that can cause wrist pain. Some common causes include:  An injury to the wrist area, such as a sprain, strain, or fracture.  Overuse of the joint.  A condition that causes increased pressure on a nerve in the wrist (carpal tunnel syndrome).  Wear and tear of the joints that occurs with aging (osteoarthritis).  A variety of other types of arthritis. Sometimes, the cause of wrist pain is not known. The pain often goes away when you follow your health care provider's instructions for relieving pain at home. If your wrist pain continues, tests may need to be done to diagnose your condition. HOME CARE INSTRUCTIONS Pay attention to any changes in your symptoms. Take these actions to help with your pain:  Rest the wrist area for at least 48 hours or as told by your health care provider.  If directed, apply ice to the injured area:  Put ice in a plastic bag.  Place a towel between your skin and the bag.  Leave the ice on for 20 minutes, 2-3 times per day.  Keep your arm raised (elevated) above the level of your heart while you are sitting or lying down.  If a splint or elastic bandage has been applied, use it as told by your health care provider.  Remove the splint or bandage only as told by your health care provider.  Loosen the splint or bandage if your fingers become numb or have a tingling feeling, or if they turn cold or blue.  Take over-the-counter and prescription medicines only as told by your health care provider.  Keep all follow-up visits as told by your health care provider. This is important. SEEK MEDICAL CARE IF:  Your pain is not helped by treatment.  Your pain gets worse. SEEK IMMEDIATE MEDICAL CARE IF:  Your fingers become swollen.  Your fingers turn white, very red, or cold and blue.  Your fingers are numb or have a tingling feeling.  You have difficulty moving your fingers.   This information is not intended to replace  advice given to you by your health care provider. Make sure you discuss any questions you have with your health care provider.   Document Released: 05/15/2005 Document Revised: 04/26/2015 Document Reviewed: 12/21/2014 Elsevier Interactive Patient Education 2016 [employer].     IF you received an x-ray today, you will receive an invoice from [facility]. Please contact [facility] at [phone] with questions or concerns regarding your invoice.   IF you received labwork today, you will receive an invoice from [employer]. Please contact [facility] at [phone] with questions or concerns regarding your invoice.   Our billing staff will not be able to assist you with questions regarding bills from these companies.  You will be contacted with the lab results as soon as they are available. The fastest way to get your results is to activate your My Chart account. Instructions are located on the last page of this paperwork. If you have not heard from [place] regarding the results in 2 weeks, please contact this office.

## 2016-05-21 ENCOUNTER — Ambulatory Visit (INDEPENDENT_AMBULATORY_CARE_PROVIDER_SITE_OTHER): Payer: Self-pay | Admitting: Urgent Care

## 2016-05-21 VITALS — BP 100/80 | HR 99 | Temp 98.6°F | Resp 16 | Ht 64.5 in | Wt 162.0 lb

## 2016-05-21 DIAGNOSIS — M79642 Pain in left hand: Secondary | ICD-10-CM

## 2016-05-21 DIAGNOSIS — S6992XD Unspecified injury of left wrist, hand and finger(s), subsequent encounter: Secondary | ICD-10-CM

## 2016-05-21 DIAGNOSIS — W19XXXD Unspecified fall, subsequent encounter: Secondary | ICD-10-CM

## 2016-05-21 DIAGNOSIS — M25532 Pain in left wrist: Secondary | ICD-10-CM

## 2016-05-21 NOTE — Progress Notes (Signed)
    MRN: [id number] DOB: Aug 03, 1961  Subjective:   Erica Bradley is a 55 y.o. female presenting for chief complaint of Follow-up (Lt Wrist)  Presenting for recheck of her left hand and wrist s/p fall. X-rays have been negative. She has been wearing wrist/thumb splint, performing rehab of her hand and notes significant improvement. She no longer has pain or swelling there. She does admit mild pain over volar surface of distal wrist. Also admits history of carpal tunnel syndrome. Would like to know if it is okay to start wearing her wrist splints for this.  Erica Bradley has a current medication list which includes the following prescription(s): lamotrigine, sertraline, escitalopram, and naproxen sodium. Also is allergic to sulfur.  Erica Bradley  has a past medical history of Back complaints; Depression; GERD (gastroesophageal reflux disease); Hyperlipidemia; Shortness of breath dyspnea; and Stroke ([place]) (2005). Also  has a past surgical history that includes Abdominal hysterectomy; Cholecystectomy; Tubal ligation (1997); Eye surgery; and Strabismus surgery (Bilateral, 08/04/2015).  Objective:   Vitals: BP 100/80 (BP Location: Right Arm, Patient Position: Sitting, Cuff Size: Large)   Pulse 99   Temp 98.6 F (37 C) (Oral)   Resp 16   Ht 5' 4.5" (1.638 m)   Wt 162 lb (73.5 kg)   SpO2 99%   BMI 27.38 kg/m   Physical Exam  Constitutional: She is oriented to person, place, and time. She appears well-developed and well-nourished.  Cardiovascular: Normal rate.   Pulmonary/Chest: Effort normal.  Musculoskeletal:       Left wrist: She exhibits tenderness (over area depicted). She exhibits normal range of motion, no bony tenderness, no swelling, no effusion, no crepitus, no deformity and no laceration.       Arms:      Left hand: She exhibits normal range of motion, no tenderness, no bony tenderness, normal capillary refill, no deformity, no laceration and no swelling. Normal sensation noted. Normal strength  noted.  Neurological: She is alert and oriented to person, place, and time.   Assessment and Plan :   1. Left wrist pain 2. Left hand pain 3. Fall, subsequent encounter 4. Hand injury, left, subsequent encounter 5. Wrist injury, left, subsequent encounter - Significantly improved. Advised patient start rehab of her left wrist. She can wear her wrist splints for carpal tunnel at night but will stop using there thumb/wrist splint. RTC as needed.  [name], PA-C [facility] [phone] 05/21/2016 3:08 PM

## 2016-05-21 NOTE — Patient Instructions (Addendum)
Please perform physical therapy for your wrist for the next 3-4 weeks. Use icing, NSAID as needed.  You may stop wearing your wrist brace for now.    IF you received an x-ray today, you will receive an invoice from [facility]. Please contact [facility] at [phone] with questions or concerns regarding your invoice.   IF you received labwork today, you will receive an invoice from [employer]. Please contact [facility] at [phone] with questions or concerns regarding your invoice.   Our billing staff will not be able to assist you with questions regarding bills from these companies.  You will be contacted with the lab results as soon as they are available. The fastest way to get your results is to activate your My Chart account. Instructions are located on the last page of this paperwork. If you have not heard from [place] regarding the results in 2 weeks, please contact this office.

## 2016-09-18 DIAGNOSIS — F1990 Other psychoactive substance use, unspecified, uncomplicated: Secondary | ICD-10-CM | POA: Insufficient documentation

## 2017-03-14 ENCOUNTER — Ambulatory Visit: Payer: Self-pay | Admitting: Urgent Care

## 2017-04-23 ENCOUNTER — Ambulatory Visit: Payer: Self-pay | Admitting: Urgent Care

## 2017-05-01 ENCOUNTER — Ambulatory Visit (INDEPENDENT_AMBULATORY_CARE_PROVIDER_SITE_OTHER): Payer: BLUE CROSS/BLUE SHIELD

## 2017-05-01 ENCOUNTER — Ambulatory Visit (INDEPENDENT_AMBULATORY_CARE_PROVIDER_SITE_OTHER): Payer: BLUE CROSS/BLUE SHIELD | Admitting: Urgent Care

## 2017-05-01 ENCOUNTER — Encounter: Payer: Self-pay | Admitting: Urgent Care

## 2017-05-01 VITALS — BP 131/89 | HR 99 | Temp 98.3°F | Ht 64.75 in | Wt 146.0 lb

## 2017-05-01 DIAGNOSIS — M5030 Other cervical disc degeneration, unspecified cervical region: Secondary | ICD-10-CM | POA: Diagnosis not present

## 2017-05-01 DIAGNOSIS — Z8739 Personal history of other diseases of the musculoskeletal system and connective tissue: Secondary | ICD-10-CM

## 2017-05-01 DIAGNOSIS — R200 Anesthesia of skin: Secondary | ICD-10-CM

## 2017-05-01 DIAGNOSIS — R350 Frequency of micturition: Secondary | ICD-10-CM | POA: Diagnosis not present

## 2017-05-01 DIAGNOSIS — R300 Dysuria: Secondary | ICD-10-CM

## 2017-05-01 DIAGNOSIS — R202 Paresthesia of skin: Secondary | ICD-10-CM | POA: Diagnosis not present

## 2017-05-01 DIAGNOSIS — M79642 Pain in left hand: Secondary | ICD-10-CM

## 2017-05-01 DIAGNOSIS — R2232 Localized swelling, mass and lump, left upper limb: Secondary | ICD-10-CM

## 2017-05-01 LAB — POCT URINALYSIS DIPSTICK
GLUCOSE UA: NEGATIVE
Leukocytes, UA: NEGATIVE
Nitrite, UA: NEGATIVE
Protein, UA: 30
Urobilinogen, UA: 0.2 E.U./dL
pH, UA: 6 (ref 5.0–8.0)

## 2017-05-01 MED ORDER — NITROFURANTOIN MONOHYD MACRO 100 MG PO CAPS: 100.0000 mg | ORAL_CAPSULE | Freq: Two times a day (BID) | ORAL | 0 refills | Status: DC

## 2017-05-01 MED ORDER — NAPROXEN SODIUM 550 MG PO TABS: 550.0000 mg | ORAL_TABLET | Freq: Two times a day (BID) | ORAL | 1 refills | Status: DC

## 2017-05-01 NOTE — Patient Instructions (Addendum)
Urinary Frequency, Adult Urinary frequency means urinating more often than usual. People with urinary frequency urinate at least 8 times in 24 hours, even if they drink a normal amount of fluid. Although they urinate more often than normal, the total amount of urine produced in a day may be normal. Urinary frequency is also called pollakiuria. What are the causes? This condition may be caused by:  A urinary tract infection.  Obesity.  Bladder problems, such as bladder stones.  Caffeine or alcohol.  Eating food or drinking fluids that irritate the bladder. These include coffee, tea, soda, artificial sweeteners, citrus, tomato-based foods, and chocolate.  Certain medicines, such as medicines that help the body get rid of extra fluid (diuretics).  Muscle or nerve weakness.  Overactive bladder.  Chronic diabetes.  Interstitial cystitis.  In men, problems with the prostate, such as an enlarged prostate.  In women, pregnancy.  In some cases, the cause may not be known. What increases the risk? This condition is more likely to develop in:  Women who have gone through menopause.  Men with prostate problems.  People with a disease or injury that affects the nerves or spinal cord.  People who have or have had a condition that affects the brain, such as a stroke.  What are the signs or symptoms? Symptoms of this condition include:  Feeling an urgent need to urinate often. The stress and anxiety of needing to find a bathroom quickly can make this urge worse.  Urinating 8 or more times in 24 hours.  Urinating as often as every 1 to 2 hours.  How is this diagnosed? This condition is diagnosed based on your symptoms, your medical history, and a physical exam. You may have tests, such as:  Blood tests.  Urine tests.  Imaging tests, such as X-rays or ultrasounds.  A bladder test.  A test of your neurological system. This is the body system that senses the need to  urinate.  A test to check for problems in the urethra and bladder called cystoscopy.  You may also be asked to keep a bladder diary. A bladder diary is a record of what you eat and drink, how often you urinate, and how much you urinate. You may need to see a health care provider who specializes in conditions of the urinary tract (urologist) or kidneys (nephrologist). How is this treated? Treatment for this condition depends on the cause. Sometimes the condition goes away on its own and treatment is not necessary. If treatment is needed, it may include:  Taking medicine.  Learning exercises that strengthen the muscles that help control urination.  Following a bladder training program. This may include: ? Learning to delay going to the bathroom. ? Double urinating (voiding). This helps if you are not completely emptying your bladder. ? Scheduled voiding.  Making diet changes, such as: ? Avoiding caffeine. ? Drinking fewer fluids, especially alcohol. ? Not drinking in the evening. ? Not having foods or drinks that may irritate the bladder. ? Eating foods that help prevent or ease constipation. Constipation can make this condition worse.  Having the nerves in your bladder stimulated. There are two options for stimulating the nerves to your bladder: ? Outpatient electrical nerve stimulation. This is done by your health care provider. ? Surgery to implant a bladder pacemaker. The pacemaker helps to control the urge to urinate.  Follow these instructions at home:  Keep a bladder diary if told to by your health care provider.  Take over-the-counter   and prescription medicines only as told by your health care provider.  Do any exercises as told by your health care provider.  Follow a bladder training program as told by your health care provider.  Make any recommended diet changes.  Keep all follow-up visits as told by your health care provider. This is important. Contact a health care  provider if:  You start urinating more often.  You feel pain or irritation when you urinate.  You notice blood in your urine.  Your urine looks cloudy.  You develop a fever.  You begin vomiting. Get help right away if:  You are unable to urinate. This information is not intended to replace advice given to you by your health care provider. Make sure you discuss any questions you have with your health care provider. Document Released: 06/01/2009 Document Revised: 09/06/2015 Document Reviewed: 03/01/2015 Elsevier Interactive Patient Education  2018 Elsevier Inc.     IF you received an x-ray today, you will receive an invoice from [facility]. Please contact [facility] at [phone] with questions or concerns regarding your invoice.   IF you received labwork today, you will receive an invoice from [facility]. Please contact [facility] at [phone] with questions or concerns regarding your invoice.   Our billing staff will not be able to assist you with questions regarding bills from these companies.  You will be contacted with the lab results as soon as they are available. The fastest way to get your results is to activate your My Chart account. Instructions are located on the last page of this paperwork. If you have not heard from us regarding the results in 2 weeks, please contact this office.      

## 2017-05-01 NOTE — Progress Notes (Signed)
MRN: [id number] DOB: 1960/12/23  Subjective:   Erica Bradley is a 56 y.o. female presenting for chief complaint of Urinary Tract Infection (hx of ); urinary burning (x 5 days); and Urinary Frequency (x 1 month)  Reports 1 month history of urinary frequency, now having dysuria the past 5 days. Patient is currently undergoing treatment for h. Pylori with amoxicillin and clarithromycin. Has had some nausea and diarrhea. Denies fever, hematuria, cloudy urine, vaginal discharge, flank pain. Also reports having numbness, tingling of his left lateral palm and 5th finger. Denies trauma, redness, swelling, neck pain.   Patricia has a current medication list which includes the following prescription(s): amoxicillin, clarithromycin, lamotrigine, sertraline, escitalopram, and naproxen sodium. Also is allergic to sulfur.  Sierah  has a past medical history of Back complaints; Depression; GERD (gastroesophageal reflux disease); Hyperlipidemia; Shortness of breath dyspnea; and Stroke ([place]) (2005). Also  has a past surgical history that includes Abdominal hysterectomy; Cholecystectomy; Tubal ligation (1997); Eye surgery; and Strabismus surgery (Bilateral, 08/04/2015).  Objective:   Vitals: BP 131/89 (BP Location: Right Arm, Patient Position: Sitting, Cuff Size: Large)   Pulse 99   Temp 98.3 F (36.8 C) (Oral)   Ht 5' 4.75" (1.645 m)   Wt 146 lb (66.2 kg)   SpO2 99%   BMI 24.48 kg/m   Physical Exam  Constitutional: She is oriented to person, place, and time. She appears well-developed and well-nourished.  HENT:  Mouth/Throat: Oropharynx is clear and moist.  Eyes: No scleral icterus.  Neck: Normal range of motion. Neck supple.  Cardiovascular: Normal rate, regular rhythm and intact distal pulses.  Exam reveals no gallop and no friction rub.   No murmur heard. Pulmonary/Chest: No respiratory distress. She has no wheezes. She has no rales.  Abdominal: Soft. Bowel sounds are normal. She exhibits no distension  and no mass. There is no tenderness. There is no guarding.  No CVA tenderness.  Lymphadenopathy:    She has no cervical adenopathy.  Neurological: She is alert and oriented to person, place, and time.  Skin: Skin is warm and dry.   Dg Cervical Spine Complete  Result Date: 05/01/2017 CLINICAL DATA:  Numbness in LEFT hand, history of arthritis in back EXAM: CERVICAL SPINE - COMPLETE 4+ VIEW COMPARISON:  None FINDINGS: Prevertebral soft tissues normal thickness. Osseous demineralization. Disc space narrowing at C5-C6 and C6-C7. Vertebral body heights maintained without fracture or subluxation. Scattered facet degenerative changes. Bony foramina patent. C1-C2 alignment normal. Lung apices clear. Atherosclerotic calcification aortic arch. IMPRESSION: Osseous demineralization with mild degenerative disc and facet disease changes of cervical spine. No acute cervical spine abnormalities. Electronically Signed   By: Lavonia Dana M.D.   On: 05/01/2017 14:03   Results for orders placed or performed in visit on 05/01/17 (from the past 24 hour(s))  POCT urinalysis dipstick     Status: Abnormal   Collection Time: 05/01/17 12:45 PM  Result Value Ref Range   Color, UA yellow    Clarity, UA cloudy    Glucose, UA negative    Bilirubin, UA small    Ketones, UA trace    Spec Grav, UA >=1.030 (A) 1.010 - 1.025   Blood, UA small    pH, UA 6.0 5.0 - 8.0   Protein, UA 30    Urobilinogen, UA 0.2 0.2 or 1.0 E.U./dL   Nitrite, UA negative    Leukocytes, UA Negative Negative   Assessment and Plan :   1. Frequency of urination 2. Dysuria - I suspect dehydration  is the primary issue given that the patient hardly drinks any water, drinks sodas. Patient insisted on an antibiotic for an UTI.  - Microalbumin, urine - POCT urinalysis dipstick - Urine Culture  3. Numbness and tingling in left hand 4. Degenerative disc disease, cervical 5. History of arthritis 6. Left hand pain 7. Localized swelling on left  hand - Will start conservative management with Anaprox. Hold off on steroid course for now. Return-to-clinic precautions discussed, patient verbalized understanding.   [name], PA-C [facility] [phone] 05/01/2017  12:53 PM

## 2017-05-02 LAB — BASIC METABOLIC PANEL
BUN/Creatinine Ratio: 14 (ref 9–23)
BUN: 17 mg/dL (ref 6–24)
CO2: 21 mmol/L (ref 20–29)
Calcium: 10.3 mg/dL — ABNORMAL HIGH (ref 8.7–10.2)
Chloride: 100 mmol/L (ref 96–106)
Creatinine, Ser: 1.18 mg/dL — ABNORMAL HIGH (ref 0.57–1.00)
GFR calc Af Amer: 59 mL/min/{1.73_m2} — ABNORMAL LOW (ref >59)
GFR calc non Af Amer: 51 mL/min/{1.73_m2} — ABNORMAL LOW (ref >59)
GLUCOSE: 104 mg/dL — AB (ref 65–99)
POTASSIUM: 4.5 mmol/L (ref 3.5–5.2)
Sodium: 138 mmol/L (ref 134–144)

## 2017-05-02 LAB — MICROALBUMIN, URINE: MICROALBUM., U, RANDOM: 48.8 ug/mL

## 2017-05-02 LAB — HEMOGLOBIN A1C
ESTIMATED AVERAGE GLUCOSE: 100 mg/dL
HEMOGLOBIN A1C: 5.1 % (ref 4.8–5.6)

## 2017-05-02 LAB — URINE CULTURE: Organism ID, Bacteria: NO GROWTH

## 2017-05-12 ENCOUNTER — Telehealth: Payer: Self-pay | Admitting: Urgent Care

## 2017-05-12 NOTE — Telephone Encounter (Signed)
Lm

## 2017-05-12 NOTE — Telephone Encounter (Signed)
Pt is calling to let Bess Harvest know that she is doing better and is still drinking a lot of water and is wanting to get her lab results   Best number [phone] ok to leave a message

## 2017-05-12 NOTE — Telephone Encounter (Signed)
Pt returned Julie's call about labs.

## 2017-05-14 NOTE — Telephone Encounter (Signed)
Left VM to call back 

## 2017-05-14 NOTE — Telephone Encounter (Signed)
Pt called back stating that she cannot afford to come in for lab only visit at this time. She stated that she understands the risk and that this is strong recommendation and will follow up under her own time.

## 2017-05-14 NOTE — Telephone Encounter (Signed)
If pt returns call, please advise that she needs visit to obtain repeat lab work.

## 2017-05-15 ENCOUNTER — Ambulatory Visit: Payer: BLUE CROSS/BLUE SHIELD | Admitting: Urgent Care

## 2017-05-29 ENCOUNTER — Telehealth: Payer: Self-pay | Admitting: Urgent Care

## 2017-05-29 NOTE — Telephone Encounter (Signed)
PATIENT SAW MARIO MANI IN [place] FOR TINGLING IN HER (L) HAND AND SOME OF HER FINGERS. SHE SAID HE DID AN X-RAY AND WAS DIAGNOSED WITH ARTHRITIS IN HER NECK. SHE SAID IT IS GETTING WORSE SO SHE CALLED HER INS. COMPANY ([employer]) TO SEE WHAT KIND OF SPECIALIST SHE NEEDS TO SEE FOR THIS TYPE OF PROBLEM. THEY TOLD HER A RHEUMATOLOGIST. SHE CALLED [facility] AND WAS TOLD SHE NEEDS TO GET A REFERRAL FIRST. SHE WANTS TO KNOW IF MARIO WILL PUT ONE IN FOR HER? BEST PHONE [phone] (CELL) [place]

## 2017-06-02 NOTE — Telephone Encounter (Signed)
Erica Bradley,  Can you please get a referral going to ortho, NOT rheumatology. I'm not in town but can put it an order when I get back. Thanks!

## 2017-06-03 NOTE — Telephone Encounter (Signed)
Mani,  I am not able to place referrals myself unfortunately, but will be happy to take care of this once referral order is placed. Thanks!

## 2017-06-04 NOTE — Telephone Encounter (Signed)
[facility], can you help me with this? I can't put in a referral from [facility]. Please forward anything that comes from this referral thereafter. If you're not able to do this, can you send it to [facility], please? Thanks, dude. Hope you're well.

## 2017-06-11 ENCOUNTER — Other Ambulatory Visit: Payer: Self-pay | Admitting: Physician Assistant

## 2017-06-11 DIAGNOSIS — M5030 Other cervical disc degeneration, unspecified cervical region: Secondary | ICD-10-CM

## 2017-06-12 ENCOUNTER — Ambulatory Visit (INDEPENDENT_AMBULATORY_CARE_PROVIDER_SITE_OTHER): Payer: BLUE CROSS/BLUE SHIELD | Admitting: Orthopaedic Surgery

## 2017-06-20 ENCOUNTER — Telehealth: Payer: Self-pay | Admitting: Radiology

## 2017-06-20 NOTE — Telephone Encounter (Signed)
At the patient's request, I made a copy of her x-ray on CD, and placed in the pick up drawer. The patient was notified.

## 2017-08-08 ENCOUNTER — Other Ambulatory Visit: Payer: Self-pay | Admitting: Orthopedic Surgery

## 2017-08-18 ENCOUNTER — Encounter (HOSPITAL_BASED_OUTPATIENT_CLINIC_OR_DEPARTMENT_OTHER): Payer: Self-pay | Admitting: *Deleted

## 2017-08-23 NOTE — H&P (Signed)
Erica Bradley is an 57 y.o. female.   CC / Reason for Visit: Left hand pain, numbness, weakness HPI: This patient is a 57 year old female who presents for evaluation of her left upper extremity.  She reports that she developed numbness some time in July or August of this year that has become progressively worse.  It is in the left hand, mainly affecting the long, ring, and small fingers and the ulnar border of the hand.  She has pain in much of the same location.  In fact, the pain originated first, and the numbness and tingling followed.  She has been using a TENS unit which has helped a great deal.  She does not work outside the home.  She was evaluated by Dr. Lynann Bologna, and ultimately underwent cervical MRI scan, also with NCS/EMG.  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  The EMG did demonstrate denervation with reinnervation of the ulnar innervated muscles, normal in the other muscles, including the cervical paraspinals.  In addition the MRI scan was largely unremarkable.  She reports that over this period of time since the onset of symptoms, she has had progressive atrophy of the intrinsics of the hand, leaving her with a weak grasp and clumsiness.  Past Medical History:  Diagnosis Date  . Back complaints   . Depression   . GERD (gastroesophageal reflux disease)    denies meds  . Hyperlipidemia   . Shortness of breath dyspnea    "because I smoke"  . Stroke [facility]) 2005   no residual per patient    Past Surgical History:  Procedure Laterality Date  . ABDOMINAL HYSTERECTOMY    . CHOLECYSTECTOMY    . EYE SURGERY     x 3   . STRABISMUS SURGERY Bilateral 08/04/2015   Procedure: REPAIR STRABISMUS BILATERAL;  Surgeon: [name], MD;  Location: [place];  Service: Ophthalmology;  Laterality: Bilateral;  . TUBAL LIGATION  1997    Family History  Problem Relation  Age of Onset  . Cancer Mother        lung ca  . Depression Mother   . Hyperlipidemia Mother   . Hypertension Unknown    Social History:  reports that she has been smoking cigarettes.  She has been smoking about 1.00 pack per day. she has never used smokeless tobacco. She reports that she does not drink alcohol or use drugs.  Allergies:  Allergies  Allergen Reactions  . Sulfur Hives    No medications prior to admission.    No results found for this or any previous visit (from the past 48 hour(s)). No results found.  Review of Systems  All other systems reviewed and are negative.   There were no vitals taken for this visit. Physical Exam  Constitutional:  WD, WN, NAD HEENT:  NCAT, EOMI Neuro/Psych:  Alert & oriented to person, place, and time; appropriate mood & affect Lymphatic: No generalized UE edema or lymphadenopathy Extremities / MSK:  Both UE are normal with respect to appearance, ranges of motion, joint stability, muscle strength/tone, sensation, & perfusion except as otherwise noted:  Left upper extremity is relatively normal, with the exception of noticeable intrinsic atrophy in the hand that seems to largely spares the thenar musculature.  Monofilament testing is 2.83 in the thumb, 3.61 for the index, long, and ring, with 4.31 in the small finger.  There is a Tinel sign over the ulnar  nerve in the proximal forearm, within the proximal portion of the FCU.  Positive [place] sign, weak intrinsics and weak FDP to the ring and small.  Reasonably good thenar strength.  Labs / Xrays:  No radiographic studies obtained today.  Assessment: 1.  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  I reviewed the time course and confirmed that these changes seem to have taken place fairly rapidly.  I reviewed the indications  for ulnar nerve decompression at the elbow and median nerve decompression at the wrist, and we also discussed a relative indication for also performing a distal nerve transfer from the AIN to the ulnar motor in the distal forearm, as a supercharged end-to-side transfer (SETS).  After a long discussion and careful consideration deliberation, she indicated she would like to proceed in this manner, and we will schedule accordingly.  At this point, it will likely be after the first of the year.  The details of the operative procedure were discussed with the patient.  Questions were invited and answered.  In addition to the goal of the procedure, the risks of the procedure to include but not limited to bleeding; infection; damage to the nerves or blood vessels that could result in bleeding, numbness, weakness, chronic pain, and the need for additional procedures; stiffness; the need for revision surgery; and anesthetic risks were reviewed.  No specific outcome was guaranteed or implied.  Informed consent was obtained.  Jolyn Nap, MD 08/23/2017, 2:00 PM

## 2017-08-26 ENCOUNTER — Ambulatory Visit (HOSPITAL_BASED_OUTPATIENT_CLINIC_OR_DEPARTMENT_OTHER)
Admission: RE | Admit: 2017-08-26 | Discharge: 2017-08-26 | Disposition: A | Discharge status: Home / Self Care | Payer: BLUE CROSS/BLUE SHIELD | Source: Ambulatory Visit | Attending: Orthopedic Surgery | Admitting: Orthopedic Surgery

## 2017-08-26 ENCOUNTER — Ambulatory Visit (HOSPITAL_BASED_OUTPATIENT_CLINIC_OR_DEPARTMENT_OTHER): Payer: BLUE CROSS/BLUE SHIELD | Admitting: Anesthesiology

## 2017-08-26 ENCOUNTER — Other Ambulatory Visit: Payer: Self-pay

## 2017-08-26 ENCOUNTER — Encounter (HOSPITAL_BASED_OUTPATIENT_CLINIC_OR_DEPARTMENT_OTHER)
Admission: RE | Disposition: A | Discharge status: Home / Self Care | Payer: Self-pay | Source: Ambulatory Visit | Attending: Orthopedic Surgery

## 2017-08-26 ENCOUNTER — Encounter (HOSPITAL_BASED_OUTPATIENT_CLINIC_OR_DEPARTMENT_OTHER): Payer: Self-pay | Admitting: Anesthesiology

## 2017-08-26 DIAGNOSIS — E785 Hyperlipidemia, unspecified: Secondary | ICD-10-CM | POA: Diagnosis not present

## 2017-08-26 DIAGNOSIS — Z79899 Other long term (current) drug therapy: Secondary | ICD-10-CM | POA: Diagnosis not present

## 2017-08-26 DIAGNOSIS — F17210 Nicotine dependence, cigarettes, uncomplicated: Secondary | ICD-10-CM | POA: Diagnosis not present

## 2017-08-26 DIAGNOSIS — G629 Polyneuropathy, unspecified: Secondary | ICD-10-CM | POA: Insufficient documentation

## 2017-08-26 DIAGNOSIS — K219 Gastro-esophageal reflux disease without esophagitis: Secondary | ICD-10-CM | POA: Diagnosis not present

## 2017-08-26 DIAGNOSIS — Z8673 Personal history of transient ischemic attack (TIA), and cerebral infarction without residual deficits: Secondary | ICD-10-CM | POA: Insufficient documentation

## 2017-08-26 DIAGNOSIS — F329 Major depressive disorder, single episode, unspecified: Secondary | ICD-10-CM | POA: Diagnosis not present

## 2017-08-26 DIAGNOSIS — G5622 Lesion of ulnar nerve, left upper limb: Secondary | ICD-10-CM | POA: Diagnosis not present

## 2017-08-26 HISTORY — PX: CARPAL TUNNEL RELEASE: SHX101

## 2017-08-26 HISTORY — PX: ULNAR NERVE TRANSPOSITION: SHX2595

## 2017-08-26 SURGERY — ULNAR NERVE DECOMPRESSION/TRANSPOSITION
Anesthesia: General | Site: Wrist | Laterality: Left

## 2017-08-26 MED ORDER — LIDOCAINE HCL (PF) 1 % IJ SOLN
INTRAMUSCULAR | Status: AC
  Filled 2017-08-26: qty 30

## 2017-08-26 MED ORDER — LIDOCAINE HCL (CARDIAC) 20 MG/ML IV SOLN
INTRAVENOUS | Status: DC | PRN
  Administered 2017-08-26: 60 mg via INTRAVENOUS

## 2017-08-26 MED ORDER — PROMETHAZINE HCL 25 MG/ML IJ SOLN: 6.2500 mg | INTRAMUSCULAR | Status: DC | PRN

## 2017-08-26 MED ORDER — LACTATED RINGERS IV SOLN: INTRAVENOUS | Status: DC

## 2017-08-26 MED ORDER — ACETAMINOPHEN 325 MG PO TABS: 650.0000 mg | ORAL_TABLET | Freq: Four times a day (QID) | ORAL | Status: DC | PRN

## 2017-08-26 MED ORDER — MIDAZOLAM HCL 2 MG/2ML IJ SOLN
INTRAMUSCULAR | Status: AC
  Filled 2017-08-26: qty 2

## 2017-08-26 MED ORDER — FENTANYL CITRATE (PF) 100 MCG/2ML IJ SOLN: 25.0000 ug | INTRAMUSCULAR | Status: DC | PRN

## 2017-08-26 MED ORDER — ROPIVACAINE HCL 7.5 MG/ML IJ SOLN
INTRAMUSCULAR | Status: DC | PRN
  Administered 2017-08-26: 15 mL via PERINEURAL

## 2017-08-26 MED ORDER — ONDANSETRON HCL 4 MG/2ML IJ SOLN
INTRAMUSCULAR | Status: AC
  Filled 2017-08-26: qty 2

## 2017-08-26 MED ORDER — FENTANYL CITRATE (PF) 100 MCG/2ML IJ SOLN
50.0000 ug | INTRAMUSCULAR | Status: DC | PRN
  Administered 2017-08-26: 50 ug via INTRAVENOUS

## 2017-08-26 MED ORDER — MIDAZOLAM HCL 2 MG/2ML IJ SOLN
1.0000 mg | INTRAMUSCULAR | Status: DC | PRN
  Administered 2017-08-26: 2 mg via INTRAVENOUS

## 2017-08-26 MED ORDER — OXYCODONE HCL 5 MG PO TABS: 5.0000 mg | ORAL_TABLET | Freq: Four times a day (QID) | ORAL | 0 refills | Status: DC | PRN

## 2017-08-26 MED ORDER — LACTATED RINGERS IV SOLN
INTRAVENOUS | Status: DC
  Administered 2017-08-26 (×3): via INTRAVENOUS

## 2017-08-26 MED ORDER — BUPIVACAINE-EPINEPHRINE (PF) 0.5% -1:200000 IJ SOLN
INTRAMUSCULAR | Status: AC
Start: 2017-08-26 — End: ?
  Filled 2017-08-26: qty 30

## 2017-08-26 MED ORDER — 0.9 % SODIUM CHLORIDE (POUR BTL) OPTIME
TOPICAL | Status: DC | PRN
  Administered 2017-08-26: 1000 mL

## 2017-08-26 MED ORDER — EPHEDRINE SULFATE 50 MG/ML IJ SOLN
INTRAMUSCULAR | Status: DC | PRN
  Administered 2017-08-26: 15 mg via INTRAVENOUS

## 2017-08-26 MED ORDER — CEFAZOLIN SODIUM-DEXTROSE 2-4 GM/100ML-% IV SOLN
INTRAVENOUS | Status: AC
  Filled 2017-08-26: qty 100

## 2017-08-26 MED ORDER — LIDOCAINE 2% (20 MG/ML) 5 ML SYRINGE
INTRAMUSCULAR | Status: AC
  Filled 2017-08-26: qty 5

## 2017-08-26 MED ORDER — PROPOFOL 10 MG/ML IV BOLUS
INTRAVENOUS | Status: AC
  Filled 2017-08-26: qty 20

## 2017-08-26 MED ORDER — DEXAMETHASONE SODIUM PHOSPHATE 10 MG/ML IJ SOLN
INTRAMUSCULAR | Status: AC
  Filled 2017-08-26: qty 1

## 2017-08-26 MED ORDER — DEXAMETHASONE SODIUM PHOSPHATE 10 MG/ML IJ SOLN
INTRAMUSCULAR | Status: DC | PRN
  Administered 2017-08-26: 10 mg via INTRAVENOUS

## 2017-08-26 MED ORDER — FENTANYL CITRATE (PF) 100 MCG/2ML IJ SOLN
INTRAMUSCULAR | Status: AC
  Filled 2017-08-26: qty 2

## 2017-08-26 MED ORDER — PROPOFOL 10 MG/ML IV BOLUS
INTRAVENOUS | Status: DC | PRN
  Administered 2017-08-26: 150 mg via INTRAVENOUS

## 2017-08-26 MED ORDER — CEFAZOLIN SODIUM-DEXTROSE 2-4 GM/100ML-% IV SOLN
2.0000 g | INTRAVENOUS | Status: AC
  Administered 2017-08-26: 2 g via INTRAVENOUS

## 2017-08-26 MED ORDER — ACETAMINOPHEN 500 MG PO TABS: 1000.0000 mg | ORAL_TABLET | Freq: Once | ORAL | Status: DC

## 2017-08-26 MED ORDER — ONDANSETRON HCL 4 MG/2ML IJ SOLN
INTRAMUSCULAR | Status: DC | PRN
  Administered 2017-08-26: 4 mg via INTRAVENOUS

## 2017-08-26 MED ORDER — PHENYLEPHRINE HCL 10 MG/ML IJ SOLN
INTRAMUSCULAR | Status: DC | PRN
  Administered 2017-08-26: 80 ug via INTRAVENOUS
  Administered 2017-08-26: 40 ug via INTRAVENOUS

## 2017-08-26 SURGICAL SUPPLY — 64 items
APL SKNCLS STERI-STRIP NONHPOA (GAUZE/BANDAGES/DRESSINGS)
BENZOIN TINCTURE PRP APPL 2/3 (GAUZE/BANDAGES/DRESSINGS) ×2 IMPLANT
BLADE MINI RND TIP GREEN BEAV (BLADE) IMPLANT
BLADE SURG 15 STRL LF DISP TIS (BLADE) ×4 IMPLANT
BLADE SURG 15 STRL SS (BLADE) ×10
BNDG CMPR 9X4 STRL LF SNTH (GAUZE/BANDAGES/DRESSINGS) ×3
BNDG COHESIVE 4X5 TAN STRL (GAUZE/BANDAGES/DRESSINGS) ×5 IMPLANT
BNDG ESMARK 4X9 LF (GAUZE/BANDAGES/DRESSINGS) ×3 IMPLANT
BNDG GAUZE ELAST 4 BULKY (GAUZE/BANDAGES/DRESSINGS) ×5 IMPLANT
CHLORAPREP W/TINT 26ML (MISCELLANEOUS) ×5 IMPLANT
CLOSURE WOUND 1/2 X4 (GAUZE/BANDAGES/DRESSINGS)
CORD BIPOLAR FORCEPS 12FT (ELECTRODE) ×5 IMPLANT
COVER BACK TABLE 60X90IN (DRAPES) ×5 IMPLANT
COVER MAYO STAND STRL (DRAPES) ×5 IMPLANT
CUFF TOURN SGL LL 18 NRW (TOURNIQUET CUFF) ×5 IMPLANT
DRAIN PENROSE 1/2X12 LTX STRL (WOUND CARE) IMPLANT
DRAIN PENROSE 1/4X12 LTX STRL (WOUND CARE) IMPLANT
DRAPE EXTREMITY T 121X128X90 (DRAPE) ×5 IMPLANT
DRAPE SURG 17X23 STRL (DRAPES) ×5 IMPLANT
DRAPE U-SHAPE 47X51 STRL (DRAPES) IMPLANT
DRSG ADAPTIC 3X8 NADH LF (GAUZE/BANDAGES/DRESSINGS) IMPLANT
DRSG EMULSION OIL 3X3 NADH (GAUZE/BANDAGES/DRESSINGS) IMPLANT
ELECT REM PT RETURN 9FT ADLT (ELECTROSURGICAL) ×5
ELECTRODE REM PT RTRN 9FT ADLT (ELECTROSURGICAL) ×3 IMPLANT
GAUZE SPONGE 4X4 12PLY STRL LF (GAUZE/BANDAGES/DRESSINGS) ×5 IMPLANT
GLOVE BIO SURGEON STRL SZ7.5 (GLOVE) ×5 IMPLANT
GLOVE BIOGEL PI IND STRL 7.0 (GLOVE) ×5 IMPLANT
GLOVE BIOGEL PI IND STRL 8 (GLOVE) ×3 IMPLANT
GLOVE BIOGEL PI INDICATOR 7.0 (GLOVE) ×6
GLOVE BIOGEL PI INDICATOR 8 (GLOVE) ×2
GLOVE ECLIPSE 6.5 STRL STRAW (GLOVE) ×5 IMPLANT
GLOVE SURG SS PI 6.5 STRL IVOR (GLOVE) ×3 IMPLANT
GOWN STRL REUS W/ TWL LRG LVL3 (GOWN DISPOSABLE) ×6 IMPLANT
GOWN STRL REUS W/TWL LRG LVL3 (GOWN DISPOSABLE) ×10
GOWN STRL REUS W/TWL XL LVL3 (GOWN DISPOSABLE) ×5 IMPLANT
LOOP VESSEL MAXI BLUE (MISCELLANEOUS) ×3 IMPLANT
NDL HYPO 25X1 1.5 SAFETY (NEEDLE) IMPLANT
NEEDLE HYPO 25X1 1.5 SAFETY (NEEDLE) IMPLANT
NS IRRIG 1000ML POUR BTL (IV SOLUTION) ×5 IMPLANT
PACK BASIN DAY SURGERY FS (CUSTOM PROCEDURE TRAY) ×5 IMPLANT
PADDING CAST ABS 4INX4YD NS (CAST SUPPLIES)
PADDING CAST ABS COTTON 4X4 ST (CAST SUPPLIES) IMPLANT
PENCIL BUTTON HOLSTER BLD 10FT (ELECTRODE) IMPLANT
SLEEVE SCD COMPRESS KNEE MED (MISCELLANEOUS) ×5 IMPLANT
SLING ARM FOAM STRAP LRG (SOFTGOODS) IMPLANT
SLING ARM MED ADULT FOAM STRAP (SOFTGOODS) IMPLANT
SLING ARM XL FOAM STRAP (SOFTGOODS) IMPLANT
SPLINT PLASTER CAST XFAST 3X15 (CAST SUPPLIES) ×7 IMPLANT
SPLINT PLASTER XTRA FASTSET 3X (CAST SUPPLIES) ×14
STOCKINETTE 6  STRL (DRAPES) ×2
STOCKINETTE 6 STRL (DRAPES) ×3 IMPLANT
STRIP CLOSURE SKIN 1/2X4 (GAUZE/BANDAGES/DRESSINGS) ×2 IMPLANT
SUT ETHILON 8 0 BV130 4 (SUTURE) ×3 IMPLANT
SUT VIC AB 0 SH 27 (SUTURE) IMPLANT
SUT VIC AB 2-0 SH 27 (SUTURE)
SUT VIC AB 2-0 SH 27XBRD (SUTURE) IMPLANT
SUT VIC AB 3-0 SH 27 (SUTURE)
SUT VIC AB 3-0 SH 27X BRD (SUTURE) ×2 IMPLANT
SUT VICRYL RAPIDE 4/0 PS 2 (SUTURE) ×5 IMPLANT
SYR 10ML LL (SYRINGE) IMPLANT
SYR BULB 3OZ (MISCELLANEOUS) ×5 IMPLANT
TOWEL OR 17X24 6PK STRL BLUE (TOWEL DISPOSABLE) ×10 IMPLANT
TOWEL OR NON WOVEN STRL DISP B (DISPOSABLE) ×5 IMPLANT
UNDERPAD 30X30 (UNDERPADS AND DIAPERS) ×5 IMPLANT

## 2017-08-26 NOTE — Anesthesia Procedure Notes (Signed)
Anesthesia Regional Block: Supraclavicular block   Pre-Anesthetic Checklist: ,, timeout performed, Correct Patient, Correct Site, Correct Laterality, Correct Procedure, Correct Position, site marked, Risks and benefits discussed,  Surgical consent,  Pre-op evaluation,  At surgeon's request and post-op pain management  Laterality: Left  Prep: chloraprep       Needles:  Injection technique: Single-shot  Needle Type: Echogenic Needle     Needle Length: 9cm  Needle Gauge: 21     Additional Needles:   Procedures:,,,, ultrasound used (permanent image in chart),,,,  Narrative:  Start time: 08/26/2017 12:55 PM End time: 08/26/2017 1:00 PM Injection made incrementally with aspirations every 5 mL.  Performed by: Personally  Anesthesiologist: Catalina Gravel, MD  Additional Notes: No pain on injection. No increased resistance to injection. Injection made in 5cc increments.  Good needle visualization.  Patient tolerated procedure well.

## 2017-08-26 NOTE — Anesthesia Procedure Notes (Signed)
Procedure Name: LMA Insertion Date/Time: 08/26/2017 1:33 PM Performed by: Willa Frater, CRNA Pre-anesthesia Checklist: Patient identified, Emergency Drugs available, Suction available and Patient being monitored Patient Re-evaluated:Patient Re-evaluated prior to induction Oxygen Delivery Method: Circle system utilized Preoxygenation: Pre-oxygenation with 100% oxygen Induction Type: IV induction Ventilation: Mask ventilation without difficulty LMA: LMA inserted LMA Size: 4.0 Number of attempts: 1 Airway Equipment and Method: Bite block Placement Confirmation: positive ETCO2 Tube secured with: Tape Dental Injury: Teeth and Oropharynx as per pre-operative assessment

## 2017-08-26 NOTE — Discharge Instructions (Signed)
Discharge Instructions   You have a dressing with a plaster splint incorporated in it. Move your fingers as much as possible, making a full fist and fully opening the fist. Elevate your hand to reduce pain & swelling of the digits.  Ice over the operative site may be helpful to reduce pain & swelling.  DO NOT USE HEAT. Pain medicine has been prescribed for you.  Resume the naproxen as you have been taking it, with 2 tablets in the morning and 2 in the evening. Take 650 mg of Tylenol every 6 hours. Take the pain medicine additionally as a rescue medicine. Leave the dressing in place until you return to our office.  You may shower, but keep the bandage clean & dry.  You may drive a car when you are off of prescription pain medications and can safely control your vehicle with both hands. Call our office to arrange a follow up appointment for 10-15 days from the date of surgery.   Please call [phone] during normal business hours or [phone] after hours for any problems. Including the following:  - excessive redness of the incisions - drainage for more than 4 days - fever of more than 101.5 F  *Please note that pain medications will not be refilled after hours or on weekends.  Regional Anesthesia Blocks  1. Numbness or the inability to move the "blocked" extremity may last from 3-48 hours after placement. The length of time depends on the medication injected and your individual response to the medication. If the numbness is not going away after 48 hours, call your surgeon.  2. The extremity that is blocked will need to be protected until the numbness is gone and the  Strength has returned. Because you cannot feel it, you will need to take extra care to avoid injury. Because it may be weak, you may have difficulty moving it or using it. You may not know what position it is in without looking at it while the block is in effect.  3. For blocks in the legs and feet, returning to weight  bearing and walking needs to be done carefully. You will need to wait until the numbness is entirely gone and the strength has returned. You should be able to move your leg and foot normally before you try and bear weight or walk. You will need someone to be with you when you first try to ensure you do not fall and possibly risk injury.  4. Bruising and tenderness at the needle site are common side effects and will resolve in a few days.  5. Persistent numbness or new problems with movement should be communicated to the surgeon or the [facility] [phone] [facility] [phone]).   Post Anesthesia Home Care Instructions  Activity: Get plenty of rest for the remainder of the day. A responsible individual must stay with you for 24 hours following the procedure.  For the next 24 hours, DO NOT: -Drive a car -Paediatric nurse -Drink alcoholic beverages -Take any medication unless instructed by your physician -Make any legal decisions or sign important papers.  Meals: Start with liquid foods such as gelatin or soup. Progress to regular foods as tolerated. Avoid greasy, spicy, heavy foods. If nausea and/or vomiting occur, drink only clear liquids until the nausea and/or vomiting subsides. Call your physician if vomiting continues.  Special Instructions/Symptoms: Your throat may feel dry or sore from the anesthesia or the breathing tube placed in your throat during surgery.  If this causes discomfort, gargle with warm salt water. The discomfort should disappear within 24 hours.  If you had a scopolamine patch placed behind your ear for the management of post- operative nausea and/or vomiting:  1. The medication in the patch is effective for 72 hours, after which it should be removed.  Wrap patch in a tissue and discard in the trash. Wash hands thoroughly with soap and water. 2. You may remove the patch earlier than 72 hours if you experience unpleasant side  effects which may include dry mouth, dizziness or visual disturbances. 3. Avoid touching the patch. Wash your hands with soap and water after contact with the patch.

## 2017-08-26 NOTE — Op Note (Signed)
08/26/2017  1:12 PM  PATIENT:  Erica Bradley  57 y.o. 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 Left ulnar neuroplasty at the elbow    2. Left median neuroplasty at the wrist    3. Left distal median-to-ulnar nerve transfer (SETS Procedure)  SURGEON: Rayvon Char. Grandville Silos, MD  PHYSICIAN ASSISTANT: Morley Kos, OPA-C  ANESTHESIA:  regional and general  SPECIMENS:  None  DRAINS:   None  EBL:  less than 50 mL  PREOPERATIVE INDICATIONS:  Erica Bradley is a  57 y.o. female with severe left ulnar neuropathy at the elbow and less severe median neuropathy at the wrist  The risks benefits and alternatives were discussed with the patient preoperatively including but not limited to the risks of infection, bleeding, nerve injury, cardiopulmonary complications, the need for revision surgery, among others, and the patient verbalized understanding and consented to proceed.  OPERATIVE IMPLANTS: none  OPERATIVE PROCEDURE:  After receiving prophylactic antibiotics and a regional block, the patient was escorted to the operative theatre and placed in a supine position.  General anesthesia was administered.  A surgical "time-out" was performed during which the planned procedure, proposed operative site, and the correct patient identity were compared to the operative consent and agreement confirmed by the circulating nurse according to current facility policy.  The exposed skin was prepped with Chloraprep and draped in the usual sterile fashion.   A sterile tourniquet was applied.  Incisions were marked.  The limb was exsanguinated with an Esmarch bandage and the tourniquet inflated to approximately 149mmHg higher than systolic BP.  A curvilinear incision was made on the medial aspect of the elbow over the course of the ulnar nerve.  Subcutaneous tissues were dissected with blunt and spreading dissection.  Care  was taken to look for and protect particular the posterior branches of the medial antebrachial cutaneous nerve.  The ulnar nerve was found fairly tightly compressed the leading edge of the cubital tunnel proper, and Osmer's ligament.  This was released.  Dissection was carried proximally for about 10 cm, completely decompressing the nerve.  Distally the nerve was followed into the FCU were both the superficial and deep fascia were released.  At the elbow was cycled and the nerve remained stable.  Attention was shifted distally.  An incision was made on the ulnar side of the thenar crease at the base of the palm, coursing in a zigzag fashion across the wrist creases and then longitudinally on the ulnar volar aspect of the forearm.  It was carried to about 11 cm proximal to the wrist crease.  Distally, spreading dissection was carried down to the palmar fascia which was split longitudinally.  The median nerve was identified proximal to the carpal tunnel proper and the roof of the carpal tunnel then released from proximal to distal under direct visualization with loupe assistance.  Guyon canal was then released.  The hyperthenar fascia was released and the motor branch of the ulnar nerve identified and decompressed in this manner.  The fascia of the forearm was split proximally along the course of the ulnar neurovascular bundle, keeping it intact.  All of the contents of the forearm was then retracted radially to expose the pronator quadratus.  The anterior interosseous nerve was then followed from proximal to distal through the pronator quadratus, splitting the muscle fibers to expose it.  At the point midway through the muscle where branches  began to innervate the muscle the nerve was divided so that it could be transferred.  The ulnar motor branch was then found again distally and a vessel loop placed around it.  It was followed with essentially visual neural lysis proximally, confirming its location every 4 or so  centimeters so that its location and orientation could be reliably confirmed proximally.  At about the 0.9 cm from the wrist crease, it was identified and dissected free from the sensory components of the nerve.  External epineurial window was created and the anterior interosseous nerve was placed into the window and secured with 8-0 nylon interrupted sutures.  The nerve made a nice loop, and was not kinked or obstructed.  There was no tension on repair with cyclical flexion and extension of the wrist.  The tourniquet was released, both wounds irrigated, and temporary dressing applied distally wall closure proceeded proximally.  Proximally the wound was reapproximated with 3-0 Vicryl deep dermal buried sutures and a running 4-0 Vicryl Rapide horizontal mattress suture.  For the distal wound, the glabrous skin was closed with interrupted 4-0 Vicryl Rapide interrupted sutures and the skin at the wrist crease and proximal was closed with 3-0 Vicryl deep dermal buried sutures and a running 4-0 Vicryl Rapide horizontal mattress suture.  A long arm dressing was applied with a volar plaster component at the wrist and she was awakened and taken to the recovery room in stable condition, breathing spontaneously.  DISPOSITION: She will be discharged home today with typical instructions, returning in 10-15 days.  At that time her splint dressing to be removed, and will likely opt for no further splinting.

## 2017-08-26 NOTE — Interval H&P Note (Signed)
History and Physical Interval Note:  08/26/2017 1:12 PM  Erica Bradley  has presented today for surgery, with the diagnosis of LEFT MEDIAN AND ULNAR NEUROPATHY  G56.02, G56.22  The various methods of treatment have been discussed with the patient and family. After consideration of risks, benefits and other options for treatment, the patient has consented to  Procedure(s) with comments: LEFT OPEN CARPAL TUNNEL RELEASE, ULNAR NEUROPLASTY AT [place] (Left) - GENERAL ANESTHESIA WITH PRE-OP BLOCK as a surgical intervention .  The patient's history has been reviewed, patient examined, no change in status, stable for surgery.  I have reviewed the patient's chart and labs.  Questions were answered to the patient's satisfaction.     Jolyn Nap

## 2017-08-26 NOTE — Progress Notes (Signed)
Assisted Dr. Turk with left, ultrasound guided, supraclavicular block. Side rails up, monitors on throughout procedure. See vital signs in flow sheet. Tolerated Procedure well. 

## 2017-08-26 NOTE — Transfer of Care (Signed)
Immediate Anesthesia Transfer of Care Note  Patient: Erica Bradley  Procedure(s) Performed: Left Ulnar Neuroplasty at the Elbow (Left Elbow) Carpal Tunnel Release, Distal Nerve Transfer (Left Wrist)  Patient Location: PACU  Anesthesia Type:General and GA combined with regional for post-op pain  Level of Consciousness: awake, alert  and drowsy  Airway & Oxygen Therapy: Patient Spontanous Breathing and Patient connected to face mask oxygen  Post-op Assessment: Report given to RN and Post -op Vital signs reviewed and stable  Post vital signs: Reviewed and stable  Last Vitals:  Vitals:   08/26/17 1315 08/26/17 1520  BP: 130/85 (!) 142/78  Pulse: 65   Resp: 14   Temp:    SpO2: 100%     Last Pain:  Vitals:   08/26/17 1124  TempSrc: Oral         Complications: No apparent anesthesia complications

## 2017-08-26 NOTE — Anesthesia Postprocedure Evaluation (Signed)
Anesthesia Post Note  Patient: Erica Bradley  Procedure(s) Performed: Left Ulnar Neuroplasty at the Elbow (Left Elbow) Carpal Tunnel Release, Distal Nerve Transfer (Left Wrist)     Patient location during evaluation: PACU Anesthesia Type: General Level of consciousness: awake and alert Pain management: pain level controlled Vital Signs Assessment: post-procedure vital signs reviewed and stable Respiratory status: spontaneous breathing, nonlabored ventilation and respiratory function stable Cardiovascular status: blood pressure returned to baseline and stable Postop Assessment: no apparent nausea or vomiting Anesthetic complications: no    Last Vitals:  Vitals:   08/26/17 1530 08/26/17 1603  BP: (!) 148/90 (!) 150/85  Pulse: 97 88  Resp: 14   Temp:  36.8 C  SpO2: 100% 96%    Last Pain:  Vitals:   08/26/17 1603  TempSrc: Oral  PainSc: 1                  Catalina Gravel

## 2017-08-26 NOTE — Anesthesia Preprocedure Evaluation (Addendum)
Anesthesia Evaluation  Patient identified by MRN, date of birth, ID band Patient awake    Reviewed: Allergy & Precautions, NPO status , Patient's Chart, lab work & pertinent test results  Airway Mallampati: II  TM Distance: >3 FB Neck ROM: Full    Dental  (+) Dental Advisory Given, Caps, Missing   Pulmonary Current Smoker (+smoked on DOS),    Pulmonary exam normal breath sounds clear to auscultation       Cardiovascular hypertension, + Peripheral Vascular Disease  Normal cardiovascular exam Rhythm:Regular Rate:Normal     Neuro/Psych PSYCHIATRIC DISORDERS Depression LUE paresthesias TIACVA, No Residual Symptoms    GI/Hepatic Neg liver ROS, GERD  ,  Endo/Other  negative endocrine ROS  Renal/GU negative Renal ROS     Musculoskeletal negative musculoskeletal ROS (+)   Abdominal   Peds  Hematology negative hematology ROS (+)   Anesthesia Other Findings Day of surgery medications reviewed with the patient.  Reproductive/Obstetrics                           Anesthesia Physical Anesthesia Plan  ASA: III  Anesthesia Plan: General   Post-op Pain Management:  Regional for Post-op pain   Induction: Intravenous  PONV Risk Score and Plan: 2 and Ondansetron, Dexamethasone and Midazolam  Airway Management Planned: LMA  Additional Equipment:   Intra-op Plan:   Post-operative Plan: Extubation in OR  Informed Consent: I have reviewed the patients History and Physical, chart, labs and discussed the procedure including the risks, benefits and alternatives for the proposed anesthesia with the patient or authorized representative who has indicated his/her understanding and acceptance.   Dental advisory given  Plan Discussed with: CRNA  Anesthesia Plan Comments: (Risks/benefits of general anesthesia discussed with patient including risk of damage to teeth, lips, gum, and tongue, nausea/vomiting,  allergic reactions to medications, and the possibility of heart attack, stroke and death.  All patient questions answered.  Patient wishes to proceed.)        Anesthesia Quick Evaluation

## 2017-08-28 ENCOUNTER — Encounter (HOSPITAL_BASED_OUTPATIENT_CLINIC_OR_DEPARTMENT_OTHER): Payer: Self-pay | Admitting: Orthopedic Surgery

## 2017-10-08 LAB — HM DIABETES EYE EXAM

## 2017-10-23 ENCOUNTER — Encounter: Payer: Self-pay | Admitting: Urgent Care

## 2017-10-23 ENCOUNTER — Ambulatory Visit (INDEPENDENT_AMBULATORY_CARE_PROVIDER_SITE_OTHER): Payer: BLUE CROSS/BLUE SHIELD

## 2017-10-23 ENCOUNTER — Ambulatory Visit: Payer: BLUE CROSS/BLUE SHIELD | Admitting: Urgent Care

## 2017-10-23 VITALS — BP 152/92 | HR 85 | Temp 97.7°F | Resp 18 | Ht 64.0 in

## 2017-10-23 DIAGNOSIS — R5383 Other fatigue: Secondary | ICD-10-CM | POA: Diagnosis not present

## 2017-10-23 DIAGNOSIS — R059 Cough, unspecified: Secondary | ICD-10-CM

## 2017-10-23 DIAGNOSIS — R05 Cough: Secondary | ICD-10-CM | POA: Diagnosis not present

## 2017-10-23 DIAGNOSIS — Z23 Encounter for immunization: Secondary | ICD-10-CM | POA: Diagnosis not present

## 2017-10-23 DIAGNOSIS — Z1159 Encounter for screening for other viral diseases: Secondary | ICD-10-CM

## 2017-10-23 DIAGNOSIS — Z9109 Other allergy status, other than to drugs and biological substances: Secondary | ICD-10-CM | POA: Diagnosis not present

## 2017-10-23 DIAGNOSIS — M1612 Unilateral primary osteoarthritis, left hip: Secondary | ICD-10-CM

## 2017-10-23 DIAGNOSIS — Z1211 Encounter for screening for malignant neoplasm of colon: Secondary | ICD-10-CM

## 2017-10-23 DIAGNOSIS — Z8619 Personal history of other infectious and parasitic diseases: Secondary | ICD-10-CM

## 2017-10-23 DIAGNOSIS — Z114 Encounter for screening for human immunodeficiency virus [HIV]: Secondary | ICD-10-CM | POA: Diagnosis not present

## 2017-10-23 DIAGNOSIS — R0981 Nasal congestion: Secondary | ICD-10-CM | POA: Diagnosis not present

## 2017-10-23 DIAGNOSIS — M25552 Pain in left hip: Secondary | ICD-10-CM

## 2017-10-23 DIAGNOSIS — F17200 Nicotine dependence, unspecified, uncomplicated: Secondary | ICD-10-CM

## 2017-10-23 MED ORDER — METHYLPREDNISOLONE ACETATE 80 MG/ML IJ SUSP: 80.0000 mg | Freq: Once | INTRAMUSCULAR | Status: DC

## 2017-10-23 MED ORDER — MONTELUKAST SODIUM 10 MG PO TABS: 10.0000 mg | ORAL_TABLET | Freq: Every day | ORAL | 3 refills | Status: DC

## 2017-10-23 MED ORDER — FLUTICASONE PROPIONATE 50 MCG/ACT NA SUSP: 2.0000 | Freq: Every day | NASAL | 11 refills | Status: DC

## 2017-10-23 MED ORDER — CETIRIZINE HCL 10 MG PO TABS: 10.0000 mg | ORAL_TABLET | Freq: Every day | ORAL | 11 refills | Status: DC

## 2017-10-23 NOTE — Patient Instructions (Addendum)
Cough, Adult Coughing is a reflex that clears your throat and your airways. Coughing helps to heal and protect your lungs. It is normal to cough occasionally, but a cough that happens with other symptoms or lasts a long time may be a sign of a condition that needs treatment. A cough may last only 2-3 weeks (acute), or it may last longer than 8 weeks (chronic). What are the causes? Coughing is commonly caused by:  Breathing in substances that irritate your lungs.  A viral or bacterial respiratory infection.  Allergies.  Asthma.  Postnasal drip.  Smoking.  Acid backing up from the stomach into the esophagus (gastroesophageal reflux).  Certain medicines.  Chronic lung problems, including COPD (or rarely, lung cancer).  Other medical conditions such as heart failure.  Follow these instructions at home: Pay attention to any changes in your symptoms. Take these actions to help with your discomfort:  Take medicines only as told by your health care provider. ? If you were prescribed an antibiotic medicine, take it as told by your health care provider. Do not stop taking the antibiotic even if you start to feel better. ? Talk with your health care provider before you take a cough suppressant medicine.  Drink enough fluid to keep your urine clear or pale yellow.  If the air is dry, use a cold steam vaporizer or humidifier in your bedroom or your home to help loosen secretions.  Avoid anything that causes you to cough at work or at home.  If your cough is worse at night, try sleeping in a semi-upright position.  Avoid cigarette smoke. If you smoke, quit smoking. If you need help quitting, ask your health care provider.  Avoid caffeine.  Avoid alcohol.  Rest as needed.  Contact a health care provider if:  You have new symptoms.  You cough up pus.  Your cough does not get better after 2-3 weeks, or your cough gets worse.  You cannot control your cough with suppressant  medicines and you are losing sleep.  You develop pain that is getting worse or pain that is not controlled with pain medicines.  You have a fever.  You have unexplained weight loss.  You have night sweats. Get help right away if:  You cough up blood.  You have difficulty breathing.  Your heartbeat is very fast. This information is not intended to replace advice given to you by your health care provider. Make sure you discuss any questions you have with your health care provider. Document Released: 02/01/2011 Document Revised: 01/11/2016 Document Reviewed: 10/12/2014 Elsevier Interactive Patient Education  2018 [place] Risks of Smoking Smoking cigarettes is very bad for your health. Tobacco smoke has over 200 known poisons in it. It contains the poisonous gases nitrogen oxide and carbon monoxide. There are over 60 chemicals in tobacco smoke that cause cancer. Smoking is difficult to quit because a chemical in tobacco, called nicotine, causes addiction or dependence. When you smoke and inhale, nicotine is absorbed rapidly into the bloodstream through your lungs. Both inhaled and non-inhaled nicotine may be addictive. What are the risks of cigarette smoke? Cigarette smokers have an increased risk of many serious medical problems, including:  Lung cancer.  Lung disease, such as pneumonia, bronchitis, and emphysema.  Chest pain (angina) and heart attack because the heart is not getting enough oxygen.  Heart disease and peripheral blood vessel disease.  High blood pressure (hypertension).  Stroke.  Oral cancer, including cancer of  the lip, mouth, or voice box.  Bladder cancer.  Pancreatic cancer.  Cervical cancer.  Pregnancy complications, including premature birth.  Stillbirths and smaller newborn babies, birth defects, and genetic damage to sperm.  Early menopause.  Lower estrogen level for women.  Infertility.  Facial  wrinkles.  Blindness.  Increased risk of broken bones (fractures).  Senile dementia.  Stomach ulcers and internal bleeding.  Delayed wound healing and increased risk of complications during surgery.  Even smoking lightly shortens your life expectancy by several years.  Because of secondhand smoke exposure, children of smokers have an increased risk of the following:  Sudden infant death syndrome (SIDS).  Respiratory infections.  Lung cancer.  Heart disease.  Ear infections.  What are the benefits of quitting? There are many health benefits of quitting smoking. Here are some of them:  Within days of quitting smoking, your risk of having a heart attack decreases, your blood flow improves, and your lung capacity improves. Blood pressure, pulse rate, and breathing patterns start returning to normal soon after quitting.  Within months, your lungs may clear up completely.  Quitting for 10 years reduces your risk of developing lung cancer and heart disease to almost that of a nonsmoker.  People who quit may see an improvement in their overall quality of life.  How do I quit smoking? Smoking is an addiction with both physical and psychological effects, and longtime habits can be hard to change. Your health care provider can recommend:  Programs and community resources, which may include group support, education, or talk therapy.  Prescription medicines to help reduce cravings.  Nicotine replacement products, such as patches, gum, and nasal sprays. Use these products only as directed. Do not replace cigarette smoking with electronic cigarettes, which are commonly called e-cigarettes. The safety of e-cigarettes is not known, and some may contain harmful chemicals.  A combination of two or more of these methods.  Where to find more information:  American Lung Association: www.lung.org  American Cancer Society: www.cancer.org Summary  Smoking cigarettes is very bad for your  health. Cigarette smokers have an increased risk of many serious medical problems, including several cancers, heart disease, and stroke.  Smoking is an addiction with both physical and psychological effects, and longtime habits can be hard to change.  By stopping right away, you can greatly reduce the risk of medical problems for you and your family.  To help you quit smoking, your health care provider can recommend programs, community resources, prescription medicines, and nicotine replacement products such as patches, gum, and nasal sprays. This information is not intended to replace advice given to you by your health care provider. Make sure you discuss any questions you have with your health care provider. Document Released: 09/12/2004 Document Revised: 08/09/2016 Document Reviewed: 08/09/2016 Elsevier Interactive Patient Education  2017 [place], Adult An allergy is when your body's defense system (immune system) overreacts to an otherwise harmless substance (allergen) that you breathe in or eat or something that touches your skin. When you come into contact with something that you are allergic to, your immune system produces certain proteins (antibodies). These proteins cause cells to release chemicals (histamines) that trigger the symptoms of an allergic reaction. Allergies often affect the nasal passages (allergic rhinitis), eyes (allergic conjunctivitis), skin (atopic dermatitis), and stomach. Allergies can be mild or severe. Allergies cannot spread from person to person (are not contagious). They can develop at any age and may be outgrown. What increases the  risk? You may be at greater risk of allergies if other people in your family have allergies. What are the signs or symptoms? Symptoms depend on what type of allergy you have. They may include:  Runny, stuffy nose.  Sneezing.  Itchy mouth, ears, or throat.  Postnasal drip.  Sore throat.  Itchy, red,  watery, or puffy eyes.  Skin rash or hives.  Stomach pain.  Vomiting.  Diarrhea.  Bloating.  Wheezing or coughing.  People with a severe allergy to food, medicine, or an insect bite may have a life-threatening allergic reaction (anaphylaxis). Symptoms of anaphylaxis include:  Hives.  Itching.  Flushed face.  Swollen lips, tongue, or mouth.  Tight or swollen throat.  Chest pain or tightness in the chest.  Trouble breathing or shortness of breath.  Rapid heartbeat.  Dizziness or fainting.  Vomiting.  Diarrhea.  Pain in the abdomen.  How is this diagnosed? This condition is diagnosed based on:  Your symptoms.  Your family and medical history.  A physical exam.  You may need to see a health care provider who specializes in treating allergies (allergist). You may also have tests, including:  Skin tests to see which allergens are causing your symptoms, such as: ? Skin prick test. In this test, your skin is pricked with a tiny needle and exposed to small amounts of possible allergens to see if your skin reacts. ? Intradermal skin test. In this test, a small amount of allergen is injected under your skin to see if your skin reacts. ? Patch test. In this test, a small amount of allergen is placed on your skin and then your skin is covered with a bandage. Your health care provider will check your skin after a couple of days to see if a rash has developed.  Blood tests.  Challenges tests. In this test, you inhale a small amount of allergen by mouth to see if you have an allergic reaction.  You may also be asked to:  Keep a food diary. A food diary is a record of all the foods and drinks you have in a day and any symptoms you experience.  Practice an elimination diet. An elimination diet involves eliminating specific foods from your diet and then adding them back in one by one to find out if a certain food causes an allergic reaction.  How is this  treated? Treatment for allergies depends on your symptoms. Treatment may include:  Cold compresses to soothe itching and swelling.  Eye drops.  Nasal sprays.  Using a saline spray or container (neti pot) to flush out the nose (nasal irrigation). These methods can help clear away mucus and keep the nasal passages moist.  Using a humidifier.  Oral antihistamines or other medicines to block allergic reaction and inflammation.  Skin creams to treat rashes or itching.  Diet changes to eliminate food allergy triggers.  Repeated exposure to tiny amounts of allergens to build up a tolerance and prevent future allergic reactions (immunotherapy). These include: ? Allergy shots. ? Oral treatment. This involves taking small doses of an allergen under the tongue (sublingual immunotherapy).  Emergency epinephrine injection (auto-injector) in case of an allergic emergency. This is a self-injectable, pre-measured medicine that must be given within the first few minutes of a serious allergic reaction.  Follow these instructions at home:  Avoid known allergens whenever possible.  If you suffer from airborne allergens, wash out your nose daily. You can do this with a saline spray or a neti  pot to flush out your nose (nasal irrigation).  Take over-the-counter and prescription medicines only as told by your health care provider.  Keep all follow-up visits as told by your health care provider. This is important.  If you are at risk of a severe allergic reaction (anaphylaxis), keep your auto-injector with you at all times.  If you have ever had anaphylaxis, wear a medical alert bracelet or necklace that states you have a severe allergy. Contact a health care provider if:  Your symptoms do not improve with treatment. Get help right away if:  You have symptoms of anaphylaxis, such as: ? Swollen mouth, tongue, or throat. ? Pain or tightness in your chest. ? Trouble breathing or shortness of  breath. ? Dizziness or fainting. ? Severe abdominal pain, vomiting, or diarrhea. This information is not intended to replace advice given to you by your health care provider. Make sure you discuss any questions you have with your health care provider. Document Released: 10/29/2002 Document Revised: 12/04/2016 Document Reviewed: 02/21/2016 Elsevier Interactive Patient Education  2018 [employer].     IF you received an x-ray today, you will receive an invoice from [facility]. Please contact [facility] at [phone] with questions or concerns regarding your invoice.   IF you received labwork today, you will receive an invoice from [facility]. Please contact [facility] at [phone] with questions or concerns regarding your invoice.   Our billing staff will not be able to assist you with questions regarding bills from these companies.  You will be contacted with the lab results as soon as they are available. The fastest way to get your results is to activate your My Chart account. Instructions are located on the last page of this paperwork. If you have not heard from [place] regarding the results in 2 weeks, please contact this office.

## 2017-10-23 NOTE — Progress Notes (Signed)
MRN: [id number] DOB: December 08, 1960  Subjective:   Erica Bradley is a 57 y.o. female presenting for ~3 month history of fatigue, clear productive cough, subjective fever. Has sinus congestion, sinus headache, post-nasal drainage, throat irritation. Reports that she has had sensation that her body is "floating". Smokes 2ppd. Patient had an endoscopy 04/2017, resulted with H. Pylori. She underwent treatment with triple therapy. Her symptoms resolved but she is worried that she has cancer. Reports severe pain of her left hip, had a steroid injection with her orthopedist back in 05/2017, plans on setting up follow up with patient.   Jovonne has a current medication list which includes the following prescription(s): aripiprazole, benztropine, gabapentin, and sertraline. Also is allergic to sulfur.  Ailana  has a past medical history of Back complaints, Depression, GERD (gastroesophageal reflux disease), Hyperlipidemia, Shortness of breath dyspnea, and Stroke ([place]) (2005). Also  has a past surgical history that includes Abdominal hysterectomy; Cholecystectomy; Tubal ligation (1997); Eye surgery; Strabismus surgery (Bilateral, 08/04/2015); Ulnar nerve transposition (Left, 08/26/2017); and Carpal tunnel release (Left, 08/26/2017).  Objective:   Vitals: BP (!) 152/92   Pulse 85   Temp 97.7 F (36.5 C) (Oral)   Resp 18   Ht 5\' 4"  (1.626 m)   SpO2 99%   BMI 24.61 kg/m   BP Readings from Last 3 Encounters:  10/23/17 (!) 152/92  08/26/17 (!) 150/85  05/01/17 131/89    Wt Readings from Last 3 Encounters:  08/26/17 143 lb 6.4 oz (65 kg)  05/01/17 146 lb (66.2 kg)  05/21/16 162 lb (73.5 kg)    Physical Exam  Constitutional: She is oriented to person, place, and time. She appears well-developed and well-nourished.  HENT:  Mouth/Throat: Oropharynx is clear and moist.  Eyes: No scleral icterus.  Cardiovascular: Normal rate, regular rhythm and intact distal pulses. Exam reveals no gallop and no friction rub.    No murmur heard. Pulmonary/Chest: No respiratory distress. She has no wheezes. She has no rales.  Neurological: She is alert and oriented to person, place, and time.  Skin: Skin is warm and dry.  Psychiatric: She has a normal mood and affect.   Dg Chest 2 View  Result Date: 10/23/2017 CLINICAL DATA:  Cough, current smoker. History of peripheral vascular disease and TIA, hypertension EXAM: CHEST - 2 VIEW COMPARISON:  PA and lateral chest x-ray of June 06, 2015 FINDINGS: The lungs are well-expanded and clear. The heart and pulmonary vascularity are normal. The mediastinum is normal in width. There is no pleural effusion. The bony thorax is unremarkable. IMPRESSION: There is no active cardiopulmonary disease. Electronically Signed   By: David  Martinique M.D.   On: 10/23/2017 11:30   Assessment and Plan :   Cough - Plan: DG Chest 2 View, Comprehensive metabolic panel, Sedimentation Rate, methylPREDNISolone acetate (DEPO-MEDROL) injection 80 mg  Other fatigue - Plan: TSH, Comprehensive metabolic panel, Sedimentation Rate  Nasal congestion - Plan: methylPREDNISolone acetate (DEPO-MEDROL) injection 80 mg  Environmental allergies - Plan: methylPREDNISolone acetate (DEPO-MEDROL) injection 80 mg  Tobacco use disorder - Plan: Comprehensive metabolic panel, CANCELED: Lipid panel  Screening for HIV (human immunodeficiency virus) - Plan: HIV antibody  Encounter for hepatitis C screening test for low risk patient - Plan: Hepatitis C antibody  History of Helicobacter pylori infection  Need for Tdap vaccination  Osteoarthritis of left hip, unspecified osteoarthritis type - Plan: methylPREDNISolone acetate (DEPO-MEDROL) injection 80 mg  Left hip pain - Plan: methylPREDNISolone acetate (DEPO-MEDROL) injection 80 mg  Discussed smoking hazards with  patient. She does not believe that it is a source of her cough, sinus symptoms. I counseled that this is an allergen and respiratory irritant and patient  agreed to start Zyrtec, Flonase, Singulair. Labs pending to look into her fatigue per patient's request. I completed some items for preventative care but deferred her annual physical exam due to her number of complaints. Patient was in agreement and will follow up with plan to perform annual physical exam at a later date.   [name], PA-C [facility] at [facility] [phone] 10/23/2017  11:06 AM

## 2017-10-24 LAB — COMPREHENSIVE METABOLIC PANEL
ALBUMIN: 4.3 g/dL (ref 3.5–5.5)
ALT: 13 IU/L (ref 0–32)
AST: 17 IU/L (ref 0–40)
Albumin/Globulin Ratio: 1.7 (ref 1.2–2.2)
Alkaline Phosphatase: 110 IU/L (ref 39–117)
BILIRUBIN TOTAL: 0.3 mg/dL (ref 0.0–1.2)
BUN/Creatinine Ratio: 14 (ref 9–23)
BUN: 13 mg/dL (ref 6–24)
CHLORIDE: 102 mmol/L (ref 96–106)
CO2: 24 mmol/L (ref 20–29)
CREATININE: 0.92 mg/dL (ref 0.57–1.00)
Calcium: 9.7 mg/dL (ref 8.7–10.2)
GFR calc Af Amer: 79 mL/min/{1.73_m2} (ref >59)
GFR calc non Af Amer: 69 mL/min/{1.73_m2} (ref >59)
GLUCOSE: 86 mg/dL (ref 65–99)
Globulin, Total: 2.5 g/dL (ref 1.5–4.5)
Potassium: 4.6 mmol/L (ref 3.5–5.2)
Sodium: 139 mmol/L (ref 134–144)
Total Protein: 6.8 g/dL (ref 6.0–8.5)

## 2017-10-24 LAB — TSH: TSH: 1.3 u[IU]/mL (ref 0.450–4.500)

## 2017-10-24 LAB — HIV ANTIBODY (ROUTINE TESTING W REFLEX): HIV SCREEN 4TH GENERATION: NONREACTIVE

## 2017-10-24 LAB — HEPATITIS C ANTIBODY

## 2017-10-24 LAB — SEDIMENTATION RATE: Sed Rate: 18 mm/hr (ref 0–40)

## 2017-10-28 ENCOUNTER — Encounter: Payer: Self-pay | Admitting: Urgent Care

## 2017-10-28 NOTE — Progress Notes (Signed)
Notified by letter.

## 2017-11-04 DIAGNOSIS — M47816 Spondylosis without myelopathy or radiculopathy, lumbar region: Secondary | ICD-10-CM | POA: Insufficient documentation

## 2018-01-05 ENCOUNTER — Encounter: Payer: Self-pay | Admitting: Urgent Care

## 2018-02-14 ENCOUNTER — Other Ambulatory Visit: Payer: Self-pay

## 2018-02-14 ENCOUNTER — Ambulatory Visit: Payer: BLUE CROSS/BLUE SHIELD | Admitting: Urgent Care

## 2018-02-14 ENCOUNTER — Ambulatory Visit (INDEPENDENT_AMBULATORY_CARE_PROVIDER_SITE_OTHER): Payer: BLUE CROSS/BLUE SHIELD

## 2018-02-14 ENCOUNTER — Encounter: Payer: Self-pay | Admitting: Urgent Care

## 2018-02-14 VITALS — BP 104/66 | HR 99 | Temp 97.9°F | Resp 16 | Ht 64.75 in | Wt 177.2 lb

## 2018-02-14 DIAGNOSIS — N63 Unspecified lump in unspecified breast: Secondary | ICD-10-CM

## 2018-02-14 DIAGNOSIS — F17210 Nicotine dependence, cigarettes, uncomplicated: Secondary | ICD-10-CM

## 2018-02-14 DIAGNOSIS — N644 Mastodynia: Secondary | ICD-10-CM

## 2018-02-14 DIAGNOSIS — Z8249 Family history of ischemic heart disease and other diseases of the circulatory system: Secondary | ICD-10-CM

## 2018-02-14 DIAGNOSIS — R079 Chest pain, unspecified: Secondary | ICD-10-CM

## 2018-02-14 DIAGNOSIS — E785 Hyperlipidemia, unspecified: Secondary | ICD-10-CM | POA: Diagnosis not present

## 2018-02-14 DIAGNOSIS — F17200 Nicotine dependence, unspecified, uncomplicated: Secondary | ICD-10-CM

## 2018-02-14 MED ORDER — PREDNISONE 10 MG PO TABS: 30.0000 mg | ORAL_TABLET | Freq: Every day | ORAL | 0 refills | Status: DC

## 2018-02-14 MED ORDER — OMEPRAZOLE 20 MG PO CPDR: 20.0000 mg | DELAYED_RELEASE_CAPSULE | Freq: Every day | ORAL | 3 refills | Status: DC

## 2018-02-14 NOTE — Progress Notes (Signed)
MRN: [id number] DOB: Jul 01, 1961  Subjective:   Erica Bradley is a 57 y.o. female presenting for 1 week history of bilateral breast pain, L>R. Feels a few lumps of her left breast. Reports intermittent mid sternal-left sided chest pain that lasts ~1 hour. Has had 4 episodes in the past month. Smokes 2ppd for >40 years. Family history of heart disease, MI. Denies nipple discharge, skin changes of the breast, nipple inversion, bleeding. Denies fever, diaphoresis, shob, dyspnea, heart racing, palpitations, radiation of her chest pain, n/v, abdominal pain. Has a history of GERD.   Erica Bradley has a current medication list which includes the following prescription(s): sertraline. Also is allergic to sulfur.  Erica Bradley  has a past medical history of Back complaints, Depression, GERD (gastroesophageal reflux disease), Hyperlipidemia, Shortness of breath dyspnea, and Stroke ([place]) (2005). Also  has a past surgical history that includes Abdominal hysterectomy; Cholecystectomy; Tubal ligation (1997); Eye surgery; Strabismus surgery (Bilateral, 08/04/2015); Ulnar nerve transposition (Left, 08/26/2017); and Carpal tunnel release (Left, 08/26/2017).  Objective:   Vitals: BP 104/66 (BP Location: Right Arm)   Pulse 99   Temp 97.9 F (36.6 C) (Oral)   Resp 16   Ht 5' 4.75" (1.645 m)   Wt 177 lb 3.2 oz (80.4 kg)   SpO2 99%   BMI 29.72 kg/m   Physical Exam  Constitutional: She is oriented to person, place, and time. She appears well-developed and well-nourished.  HENT:  Mouth/Throat: Oropharynx is clear and moist.  Eyes: Right eye exhibits no discharge. Left eye exhibits no discharge. No scleral icterus.  Neck: Normal range of motion. Neck supple. No thyromegaly present.  Cardiovascular: Normal rate, regular rhythm and intact distal pulses. Exam reveals no gallop and no friction rub.  No murmur heard. Pulmonary/Chest: No stridor. No respiratory distress. She has no wheezes. She has no rales. Chest wall is not dull  to percussion. She exhibits no mass, no tenderness, no bony tenderness, no laceration, no crepitus, no edema, no deformity, no swelling and no retraction. Right breast exhibits no inverted nipple, no mass, no nipple discharge, no skin change and no tenderness. Left breast exhibits no inverted nipple, no mass, no nipple discharge, no skin change and no tenderness.  Multiple firm, mobile, tender mass of varying sizes of both breasts, L>R.  Abdominal: Soft. Bowel sounds are normal. She exhibits no distension and no mass. There is no tenderness. There is no rebound and no guarding.  Neurological: She is alert and oriented to person, place, and time.  Skin: Skin is warm and dry.  Psychiatric: She has a normal mood and affect.   ECG interpretation - Normal sinus rhythm at 70bpm.   Dg Chest 2 View  Result Date: 02/14/2018 CLINICAL DATA:  Chest pain.  History of smoking. EXAM: CHEST - 2 VIEW COMPARISON:  10/23/2017 FINDINGS: Cardiomediastinal silhouette is normal. Mediastinal contours appear intact. There is no evidence of focal airspace consolidation, pleural effusion or pneumothorax. Osseous structures are without acute abnormality. Soft tissues are grossly normal. IMPRESSION: No active cardiopulmonary disease. Electronically Signed   By: Fidela Salisbury M.D.   On: 02/14/2018 13:03   Assessment and Plan :   Breast pain in female - Plan: MM Digital Diagnostic Bilat, US BREAST BILATERAL  Fibrous breast lumps - Plan: MM Digital Diagnostic Bilat, US BREAST BILATERAL  Family history of heart disease - Plan: Ambulatory referral to Cardiology  Tobacco use disorder - Plan: Ambulatory referral to Cardiology  Smoking greater than 40 pack years - Plan: Ambulatory  referral to Cardiology, DG Chest 2 View  Chest pain, unspecified type - Plan: Basic metabolic panel, EKG 75-TZGY, Ambulatory referral to Cardiology, TSH, DG Chest 2 View  Hyperlipidemia, unspecified hyperlipidemia type - Plan: Ambulatory  referral to Cardiology, Lipid panel  Patient is high risk for ACS given her history of smoking. Will refer to cardiology for work up asap. In the meantime, patient will try Prilosec to address a recurrence of GERD. I suspect she is having pain from fibrocystic disease. She reports a history of CKD III. This is not supported by her most recent creatinine. Patient has never been evaluated by a nephrologist. Counseled that she may be able to use NSAIDs for pain and inflammation of her breasts but she prefers to be cautious with her kidneys. Will use short steroid course of prednisone 30mg  for 5 days. Return-to-clinic precautions discussed, patient verbalized understanding.   [name], PA-C Primary [facility] at [facility] [place] [phone] 02/14/2018  12:30 PM

## 2018-02-14 NOTE — Patient Instructions (Addendum)
Fibrocystic Breast Changes Fibrocystic breast changes are changes in breast tissue that can cause breasts to become swollen, lumpy, or painful. This can happen due to buildup of scar-like tissue (fibrous tissue) or the forming of fluid-filled lumps (cysts) in the breast. This is a common condition, and it is not cancerous (is benign). The exact cause is not known, but it seems to occur when women go through hormonal changes during their menstrual cycle. Fibrocystic breast changes can affect one or both breasts. What are the causes? The exact cause of fibrocystic breast changes is not known. However, this condition:  May be related to the female hormones estrogen and progesterone.  May be influenced by family traits that get passed from parent to child (genetics).  What are the signs or symptoms? Symptoms of this condition may affect one or both breasts, and may include:  Tenderness, mild discomfort, or pain.  Swelling.  Rope-like tissue that can be felt when touching the breast.  Lumps in one or both breasts.  Changes in breast size. Breasts may get larger before the menstrual period and smaller after the menstrual period.  Green or dark Rosman discharge from the nipple.  Symptoms are usually worse before menstrual periods start, and they get better toward the end of menstrual periods. How is this diagnosed? This condition is diagnosed based on your medical history and a physical exam of your breasts. You may also have tests, such as:  A breast X-ray (mammogram).  Ultrasound of your breasts.  MRI.  Removal of a breast tissue sample for testing (breast biopsy). This may be done if your health care provider thinks that something else may be causing changes in your breasts.  How is this treated? Often, treatment is not needed for this condition. In some cases, treatment may include:  Taking over-the-counter pain relievers to help lessen pain or discomfort.  Limiting or avoiding  caffeine. Foods and beverages that contain caffeine include chocolate, soda, coffee, and tea.  Reducing sugar and fat in your diet.  Your health care provider may also recommend:  A procedure to remove fluid from a cyst that is causing pain (fine needle aspiration).  Surgery to remove a cyst that is large or tender or does not go away.  Follow these instructions at home:  Examine your breasts after every menstrual period. If you do not have menstrual periods, check your breasts on the first day of every month. Feel for changes in your breasts, such as: ? More tenderness. ? A new growth. ? A change in size. ? A change in an existing lump.  Take over-the-counter and prescription medicines only as told by your health care provider.  Wear a well-fitted support or sports bra, especially when exercising.  Decrease or avoid caffeine, fat, and sugar in your diet as directed by your health care provider. Contact a health care provider if:  You have fluid leaking from your nipple, especially if it is bloody.  You have new lumps or bumps in your breast.  Your breast becomes enlarged, red, and painful.  You have areas of your breast that pucker inward.  Your nipple appears flat or indented. Get help right away if:  You have redness of your breast and the redness is spreading. Summary  Fibrocystic breast changes are changes in breast tissue that can cause breasts to become swollen, lumpy, or painful.  This condition may be related to the female hormones estrogen and progesterone.  With this condition, it is important to examine   your breasts after every menstrual period. If you do not have menstrual periods, check your breasts on the first day of every month. This information is not intended to replace advice given to you by your health care provider. Make sure you discuss any questions you have with your health care provider. Document Released: 05/22/2006 Document Revised: 04/16/2016  Document Reviewed: 04/03/2016 Elsevier Interactive Patient Education  2017 [employer].     IF you received an x-ray today, you will receive an invoice from [facility]. Please contact [facility] at [phone] with questions or concerns regarding your invoice.   IF you received labwork today, you will receive an invoice from [facility]. Please contact [facility] at [phone] with questions or concerns regarding your invoice.   Our billing staff will not be able to assist you with questions regarding bills from these companies.  You will be contacted with the lab results as soon as they are available. The fastest way to get your results is to activate your My Chart account. Instructions are located on the last page of this paperwork. If you have not heard from [place] regarding the results in 2 weeks, please contact this office.

## 2018-02-15 LAB — BASIC METABOLIC PANEL
BUN/Creatinine Ratio: 15 (ref 9–23)
BUN: 14 mg/dL (ref 6–24)
CALCIUM: 10 mg/dL (ref 8.7–10.2)
CO2: 21 mmol/L (ref 20–29)
CREATININE: 0.94 mg/dL (ref 0.57–1.00)
Chloride: 102 mmol/L (ref 96–106)
GFR calc Af Amer: 77 mL/min/{1.73_m2} (ref >59)
GFR, EST NON AFRICAN AMERICAN: 67 mL/min/{1.73_m2} (ref >59)
Glucose: 89 mg/dL (ref 65–99)
POTASSIUM: 4.5 mmol/L (ref 3.5–5.2)
Sodium: 140 mmol/L (ref 134–144)

## 2018-02-15 LAB — LIPID PANEL
Chol/HDL Ratio: 6.7 ratio — ABNORMAL HIGH (ref 0.0–4.4)
Cholesterol, Total: 283 mg/dL — ABNORMAL HIGH (ref 100–199)
HDL: 42 mg/dL (ref >39)
LDL Calculated: 163 mg/dL — ABNORMAL HIGH (ref 0–99)
Triglycerides: 392 mg/dL — ABNORMAL HIGH (ref 0–149)
VLDL CHOLESTEROL CAL: 78 mg/dL — AB (ref 5–40)

## 2018-02-15 LAB — TSH: TSH: 3.52 u[IU]/mL (ref 0.450–4.500)

## 2018-02-16 ENCOUNTER — Other Ambulatory Visit: Payer: Self-pay | Admitting: Urgent Care

## 2018-02-16 ENCOUNTER — Telehealth: Payer: Self-pay | Admitting: Urgent Care

## 2018-02-16 DIAGNOSIS — N63 Unspecified lump in unspecified breast: Secondary | ICD-10-CM

## 2018-02-16 DIAGNOSIS — N644 Mastodynia: Secondary | ICD-10-CM

## 2018-02-16 NOTE — Telephone Encounter (Signed)
Left pt voicemail to call back. I have scheduled pt's appt for mammogram and breast U/S at [facility] on 02/20/18 at 11am with an arrival of 10:40am. They stated if pt has had mammogram since 2009 she will need to obtain records before appt with [facility] by either getting the films from the previous office, filling a release out at [facility], or fill out a release on [facility] website. Pt will also need to bring insurance card and photo ID to her appt and plan for appt to be 1hr and a half to 2 hrs. I tried calling pt to see if she has had a mammogram since 2009 and inform her of needing to get records but had to leave a message to return my call. I will let [facility] know if pt has had records or not once I speak with pt. [facility] has also changed orders and sent for co-sign. Thanks!

## 2018-02-17 ENCOUNTER — Telehealth: Payer: Self-pay | Admitting: Urgent Care

## 2018-02-17 NOTE — Telephone Encounter (Signed)
Pt has not had mammogram since 2009. She is scheduled for 7/5 appt and is aware of date, time, location, and to bring ID and insurance card. Thank you!

## 2018-02-17 NOTE — Telephone Encounter (Signed)
Thank you for the hard work!

## 2018-02-18 ENCOUNTER — Encounter: Payer: Self-pay | Admitting: Urgent Care

## 2018-02-20 ENCOUNTER — Ambulatory Visit
Admission: RE | Admit: 2018-02-20 | Discharge: 2018-02-20 | Disposition: A | Discharge status: Home / Self Care | Payer: BLUE CROSS/BLUE SHIELD | Source: Ambulatory Visit | Attending: Urgent Care | Admitting: Urgent Care

## 2018-02-20 DIAGNOSIS — N644 Mastodynia: Secondary | ICD-10-CM

## 2018-02-20 DIAGNOSIS — N63 Unspecified lump in unspecified breast: Secondary | ICD-10-CM

## 2018-06-04 ENCOUNTER — Encounter: Payer: Self-pay | Admitting: Urgent Care

## 2019-02-04 ENCOUNTER — Encounter (HOSPITAL_COMMUNITY): Payer: Self-pay | Admitting: Emergency Medicine

## 2019-02-04 ENCOUNTER — Ambulatory Visit (HOSPITAL_COMMUNITY)
Admission: EM | Admit: 2019-02-04 | Discharge: 2019-02-04 | Disposition: A | Discharge status: Home / Self Care | Payer: BLUE CROSS/BLUE SHIELD | Attending: Family Medicine | Admitting: Family Medicine

## 2019-02-04 ENCOUNTER — Other Ambulatory Visit: Payer: Self-pay

## 2019-02-04 DIAGNOSIS — S80862A Insect bite (nonvenomous), left lower leg, initial encounter: Secondary | ICD-10-CM

## 2019-02-04 DIAGNOSIS — W57XXXA Bitten or stung by nonvenomous insect and other nonvenomous arthropods, initial encounter: Secondary | ICD-10-CM | POA: Diagnosis not present

## 2019-02-04 MED ORDER — DOXYCYCLINE HYCLATE 100 MG PO CAPS: 100.0000 mg | ORAL_CAPSULE | Freq: Two times a day (BID) | ORAL | 0 refills | Status: DC

## 2019-02-04 NOTE — Discharge Instructions (Addendum)
Watch area for infection Fill and take the tetracycline if the redness increases over time Call for problems

## 2019-02-04 NOTE — ED Triage Notes (Signed)
Pt here for removal of tick from behind left knee

## 2019-02-04 NOTE — ED Provider Notes (Signed)
[facility]    CSN: [id number] Arrival date & time: 02/04/19  [place]      History   Chief Complaint Chief Complaint  Patient presents with  . Tick Removal    HPI Erica Bradley is a 58 y.o. female.   HPI   Tick bite to back of leg.  Removed tick 3 d ago,  Today noticed red with black center and is worried about retained tick. No fever, other rash, myalgias or arthralgias  Past Medical History:  Diagnosis Date  . Back complaints   . Depression   . GERD (gastroesophageal reflux disease)    denies meds  . Hyperlipidemia   . Shortness of breath dyspnea    "because I smoke"  . Stroke [facility]) 2005   no residual per patient    Patient Active Problem List   Diagnosis Date Noted  . Essential hypertension, benign 11/25/2013  . Abdominal pain, epigastric 11/18/2013  . Dermatitis 10/28/2013  . Carotid stenosis 10/28/2013  . Elevated blood pressure 10/28/2013  . Anemia 10/28/2013  . TIA (transient ischemic attack) 10/28/2013  . Hyperlipidemia 10/28/2013  . Personal history of colonic polyps 10/28/2013    Past Surgical History:  Procedure Laterality Date  . ABDOMINAL HYSTERECTOMY    . CARPAL TUNNEL RELEASE Left 08/26/2017   Procedure: Carpal Tunnel Release, Distal Nerve Transfer;  Surgeon: [name], MD;  Location: [place];  Service: Orthopedics;  Laterality: Left;  . CHOLECYSTECTOMY    . EYE SURGERY     x 3   . STRABISMUS SURGERY Bilateral 08/04/2015   Procedure: REPAIR STRABISMUS BILATERAL;  Surgeon: [name], MD;  Location: [place];  Service: Ophthalmology;  Laterality: Bilateral;  . TUBAL LIGATION  1997  . ULNAR NERVE TRANSPOSITION Left 08/26/2017   Procedure: Left Ulnar Neuroplasty at the Elbow;  Surgeon: [name], MD;  Location: [place];  Service: Orthopedics;  Laterality: Left;  GENERAL ANESTHESIA WITH PRE-OP BLOCK    OB History   No obstetric history on file.      Home  Medications    Prior to Admission medications   Medication Sig Start Date End Date Taking? Authorizing Provider  doxycycline (VIBRAMYCIN) 100 MG capsule Take 1 capsule (100 mg total) by mouth 2 (two) times daily. 02/04/19   Raylene Everts, MD  omeprazole (PRILOSEC) 20 MG capsule Take 1 capsule (20 mg total) by mouth daily. 02/14/18   [name], PA-C  sertraline (ZOLOFT) 100 MG tablet Take 0.5 tablets by mouth daily.    [provider]    Family History Family History  Problem Relation Age of Onset  . Cancer Mother        lung ca  . Depression Mother   . Hyperlipidemia Mother   . Hypertension Unknown     Social History Social History   Tobacco Use  . Smoking status: Current Every Day Smoker    Packs/day: 1.00    Types: Cigarettes  . Smokeless tobacco: Never Used  Substance Use Topics  . Alcohol use: No  . Drug use: No     Allergies   Sulfur   Review of Systems Review of Systems  Constitutional: Negative for chills and fever.  HENT: Negative for ear pain and sore throat.   Eyes: Negative for pain and visual disturbance.  Respiratory: Negative for cough and shortness of breath.   Cardiovascular: Negative for chest pain and palpitations.  Gastrointestinal: Negative for abdominal pain and vomiting.  Genitourinary:  Negative for dysuria and hematuria.  Musculoskeletal: Negative for arthralgias and back pain.  Skin: Positive for wound. Negative for color change and rash.  Neurological: Negative for seizures and syncope.  All other systems reviewed and are negative.    Physical Exam Triage Vital Signs ED Triage Vitals  Enc Vitals Group     BP 02/04/19 1705 (!) 168/81     Pulse Rate 02/04/19 1705 85     Resp 02/04/19 1705 18     Temp 02/04/19 1705 (!) 97.2 F (36.2 C)     Temp Source 02/04/19 1705 Oral     SpO2 02/04/19 1705 98 %     Weight --      Height --      Head Circumference --      Peak Flow --      Pain Score 02/04/19 1706 0     Pain  Loc --      Pain Edu? --      Excl. in [place]? --    No data found.  Updated Vital Signs BP (!) 168/81 (BP Location: Left Arm)   Pulse 85   Temp (!) 97.2 F (36.2 C) (Oral)   Resp 18   SpO2 98%       Physical Exam Constitutional:      General: She is not in acute distress.    Appearance: She is well-developed.  HENT:     Head: Normocephalic and atraumatic.  Eyes:     Conjunctiva/sclera: Conjunctivae normal.     Pupils: Pupils are equal, round, and reactive to light.  Neck:     Musculoskeletal: Normal range of motion.  Cardiovascular:     Rate and Rhythm: Normal rate.  Pulmonary:     Effort: Pulmonary effort is normal. No respiratory distress.  Abdominal:     General: There is no distension.     Palpations: Abdomen is soft.  Musculoskeletal: Normal range of motion.  Skin:    General: Skin is warm and dry.     Comments: Of the back of the left leg at the crease of the popliteal fossa there is a 1 cm nodule with a dark center.  After cleansing with alcohol this is removed with tweezers.  There were some tick parts that easily were peeled away.  Antibiotic ointment and bandage placed.  Neurological:     Mental Status: She is alert.      UC Treatments / Results  Labs (all labs ordered are listed, but only abnormal results are displayed) Labs Reviewed - No data to display  EKG None  Radiology No results found.  Procedures Procedures (including critical care time)  Medications Ordered in UC Medications - No data to display  Initial Impression / Assessment and Plan / UC Course  I have reviewed the triage vital signs and the nursing notes.  Pertinent labs & imaging results that were available during my care of the patient were reviewed by me and considered in my medical decision making (see chart for details).     Explained to the patient that this redness was just inflammation from the tick bite and can be expected.  If she would see an increasing redness  especially bull's-eye then she would need to take Vibramycin.  I am giving her the doxycycline to take, if needed and instructions Final Clinical Impressions(s) / UC Diagnoses   Final diagnoses:  Tick bite of left lower leg, initial encounter     Discharge Instructions  Watch area for infection Fill and take the tetracycline if the redness increases over time Call for problems   ED Prescriptions    Medication Sig Dispense Auth. Provider   doxycycline (VIBRAMYCIN) 100 MG capsule Take 1 capsule (100 mg total) by mouth 2 (two) times daily. 20 capsule Raylene Everts, MD     Controlled Substance Prescriptions [place] Controlled Substance Registry consulted? Not Applicable   Raylene Everts, MD 02/04/19 [phone]

## 2019-02-26 LAB — LIPID PANEL
Cholesterol: 285 — AB (ref 0–200)
HDL: 41 (ref 35–70)
LDL Cholesterol: 199
Triglycerides: 241 — AB (ref 40–160)

## 2019-02-26 LAB — BASIC METABOLIC PANEL
Creatinine: 1.2 — AB (ref 0.5–1.1)
Glucose: 122

## 2019-02-26 LAB — CBC AND DIFFERENTIAL
HCT: 52 — AB (ref 36–46)
Hemoglobin: 17.3 — AB (ref 12.0–16.0)

## 2019-03-22 ENCOUNTER — Telehealth: Payer: Self-pay | Admitting: Family Medicine

## 2019-03-22 NOTE — Telephone Encounter (Signed)
Patient is a former patient of [name] PA-C Her labs were faxed here from [facility] She has not been seen since June 2019  Left message for her to call to make an appointment with Maximiano Coss FNP She should also come in to discuss her results. Results sent for abstracting but the summary is as follows  TC 285 HDL 41 TG 241 LDL 199  GLUCOSE 122 Cr 1.2  Hgb 17.3 Hct 51.9   Scheduling team: please assist with helping patient set up with a new PCP

## 2019-03-24 ENCOUNTER — Telehealth (INDEPENDENT_AMBULATORY_CARE_PROVIDER_SITE_OTHER): Payer: BLUE CROSS/BLUE SHIELD | Admitting: Family Medicine

## 2019-03-24 ENCOUNTER — Other Ambulatory Visit: Payer: Self-pay

## 2019-03-24 DIAGNOSIS — E785 Hyperlipidemia, unspecified: Secondary | ICD-10-CM

## 2019-03-24 DIAGNOSIS — R5383 Other fatigue: Secondary | ICD-10-CM

## 2019-03-24 DIAGNOSIS — R6883 Chills (without fever): Secondary | ICD-10-CM | POA: Diagnosis not present

## 2019-03-24 DIAGNOSIS — R0600 Dyspnea, unspecified: Secondary | ICD-10-CM | POA: Diagnosis not present

## 2019-03-24 DIAGNOSIS — R030 Elevated blood-pressure reading, without diagnosis of hypertension: Secondary | ICD-10-CM

## 2019-03-24 NOTE — Progress Notes (Signed)
Virtual Visit via Telephone Note  I connected with Erica Bradley on 03/24/19 at 1:38 PM by telephone and verified that I am speaking with the correct person using two identifiers.   I discussed the limitations, risks, security and privacy concerns of performing an evaluation and management service by telephone and the availability of in person appointments. I also discussed with the patient that there may be a patient responsible charge related to this service. The patient expressed understanding and agreed to proceed, consent obtained  Chief complaint: Discuss lab results, possible COVID symptoms  History of Present Illness: Erica Bradley is a 58 y.o. female  Dyspnea: Started about 2 weeks ago - shortness of breath. New for her even though history of COPD. Associated with hot and cold spells( temp nl - 97).  Off and on dyspnea throughout the day - relapsing/remitting. Not short of breath at rest now, but has had at rest.  Vague feeling in chest - no chest pain.  Dyspnea, fatigue/weakness and hot/cold spells feel about the same. No worsening. Had COVID testing performed 9 days ago at [facility], still waiting on results.  No known exposure to covid positive person.  Has been self isolating while waiting on results.   Hyperlipidemia:  Lab Results  Component Value Date   CHOL 283 (H) 02/14/2018   HDL 42 02/14/2018   LDLCALC 163 (H) 02/14/2018   TRIG 392 (H) 02/14/2018   CHOLHDL 6.7 (H) 02/14/2018   Lab Results  Component Value Date   ALT 13 10/23/2017   AST 17 10/23/2017   ALKPHOS 110 10/23/2017   BILITOT 0.3 10/23/2017    See other telephone message from August 3.  Results from lab work obtained at [facility] were noted, with total cholesterol 285, HDL 41, triglycerides 241, LDL 199.  Additionally glucose was 122, creatinine 1.2.  Hemoglobin 17.3, hematocrit 51.9.  Plan for appointment with Maximiano Coss, FNP. Had blood drawn at drawing center.   Last visit here with  [name] in 01/2018. Referred to cardiology - [facility].  No notes seen.   History of hypertension, BP 168/81 on June 18 visit at urgent care.  Not on meds when seen last June or at recent urgent care eval.  Blood pressure 146/99 at pain medicine visit March 12 of this year. No BP meds currently. Has had some normal readings at other times at her office visit s in the 120's.   She reports labs were drawn with plan to follow up with psychiatrist, not here   Patient Active Problem List   Diagnosis Date Noted  . Essential hypertension, benign 11/25/2013  . Abdominal pain, epigastric 11/18/2013  . Dermatitis 10/28/2013  . Carotid stenosis 10/28/2013  . Elevated blood pressure 10/28/2013  . Anemia 10/28/2013  . TIA (transient ischemic attack) 10/28/2013  . Hyperlipidemia 10/28/2013  . Personal history of colonic polyps 10/28/2013   Past Medical History:  Diagnosis Date  . Back complaints   . Depression   . GERD (gastroesophageal reflux disease)    denies meds  . Hyperlipidemia   . Shortness of breath dyspnea    "because I smoke"  . Stroke [facility]) 2005   no residual per patient   Past Surgical History:  Procedure Laterality Date  . ABDOMINAL HYSTERECTOMY    . CARPAL TUNNEL RELEASE Left 08/26/2017   Procedure: Carpal Tunnel Release, Distal Nerve Transfer;  Surgeon: [name], MD;  Location: [place];  Service: Orthopedics;  Laterality: Left;  .  CHOLECYSTECTOMY    . EYE SURGERY     x 3   . STRABISMUS SURGERY Bilateral 08/04/2015   Procedure: REPAIR STRABISMUS BILATERAL;  Surgeon: [name], MD;  Location: [place];  Service: Ophthalmology;  Laterality: Bilateral;  . TUBAL LIGATION  1997  . ULNAR NERVE TRANSPOSITION Left 08/26/2017   Procedure: Left Ulnar Neuroplasty at the Elbow;  Surgeon: [name], MD;  Location: [place];  Service: Orthopedics;  Laterality: Left;  GENERAL ANESTHESIA WITH PRE-OP BLOCK    Allergies  Allergen Reactions  . Sulfur Hives   Prior to Admission medications   Medication Sig Start Date End Date Taking? Authorizing Provider  sertraline (ZOLOFT) 100 MG tablet Take 0.5 tablets by mouth daily.    [provider]   Social History   Socioeconomic History  . Marital status: Divorced    Spouse name: Not on file  . Number of children: Not on file  . Years of education: Not on file  . Highest education level: Not on file  Occupational History  . Not on file  Social Needs  . Financial resource strain: Not on file  . Food insecurity    Worry: Not on file    Inability: Not on file  . Transportation needs    Medical: Not on file    Non-medical: Not on file  Tobacco Use  . Smoking status: Current Every Day Smoker    Packs/day: 1.00    Types: Cigarettes  . Smokeless tobacco: Never Used  Substance and Sexual Activity  . Alcohol use: No  . Drug use: No  . Sexual activity: Not on file  Lifestyle  . Physical activity    Days per week: Not on file    Minutes per session: Not on file  . Stress: Not on file  Relationships  . Social Herbalist on phone: Not on file    Gets together: Not on file    Attends religious service: Not on file    Active member of club or organization: Not on file    Attends meetings of clubs or organizations: Not on file    Relationship status: Not on file  . Intimate partner violence    Fear of current or ex partner: Not on file    Emotionally abused: Not on file    Physically abused: Not on file    Forced sexual activity: Not on file  Other Topics Concern  . Not on file  Social History Narrative  . Not on file     Observations/Objective: Speaking in full sentences during conversation on phone, no apparent respiratory distress.  Appropriate responses with understanding of plan expressed and all questions were answered.  Assessment and Plan: Dyspnea, unspecified type - Plan:  fatigue, unspecified type -  Plan:  Chills - Plan:   -Reportedly has testing pending from 9 days ago.  Symptoms concerning for possible COVID-19 infection with persistent episodic dyspnea, chills/subjective fever, generalized weakness/fatigue.  Recommend she call [facility]/health department tomorrow if has not received results as that would have been 10 days.  -If testing is negative, with persistent symptoms would recommend evaluation including possible x-rays or other testing for her continued symptoms.   - ER precautions were given if any worsening shortness of breath or new chest pains given her history of reported COPD, as well as cardiac risk factors of tobacco use, hyperlipidemia and elevated blood pressure    Hyperlipidemia, unspecified hyperlipidemia type - Plan:   -  Significant LDL elevation noted on labs above.  Plan for follow-up with new provider in our office to review these labs further and plan.  -Since labs were apparently intended to go to her psychiatrist, recommended she call [facility] to see if those can be redirected  Elevated blood pressure reading - Plan:   -Follow-up in office for further evaluation/repeat testing, home blood pressure readings also may be helpful.  -ER precautions if any chest pains, worsening shortness of breath or other worsening symptoms in the meantime.   Follow Up Instructions:  First available with [facility] for follow up labs.  ER precautions /rtc precautions as below  Patient Instructions  I would recommend calling health department tomorrow if not yet received results for Covid test. If test is negative, I would recommend evaluation including possible xrays or other testing for your continued symptoms.  That can be done here or other medical provider.   If you are having any new chest pains, or worsening shortness of breath go immediately to emergency room so they can do other testing.   Return to the clinic or go to the nearest emergency room if any of your symptoms worsen or  new symptoms occur.  For elevated cholesterol, and elevated BP - I would recommend discussing with primary care provider. You can call [facility] to see if they can forward your results.       I discussed the assessment and treatment plan with the patient. The patient was provided an opportunity to ask questions and all were answered. The patient agreed with the plan and demonstrated an understanding of the instructions.   The patient was advised to call back or seek an in-person evaluation if the symptoms worsen or if the condition fails to improve as anticipated.  I provided 26 minutes of non-face-to-face time during this encounter.  Signed,   Merri Ray, MD [facility].  03/24/19

## 2019-03-24 NOTE — Patient Instructions (Addendum)
I would recommend calling health department tomorrow if not yet received results for Covid test. If test is negative, I would recommend evaluation including possible xrays or other testing for your continued symptoms.  That can be done here or other medical provider.   If you are having any new chest pains, or worsening shortness of breath go immediately to emergency room so they can do other testing.   Return to the clinic or go to the nearest emergency room if any of your symptoms worsen or new symptoms occur.  For elevated cholesterol, and elevated BP - I would recommend discussing with primary care provider. You can call [facility] to see if they can forward your results.

## 2019-03-24 NOTE — Progress Notes (Signed)
Pt is wanting to to confirm the covid result. She says she went for test on Monday. She believes she will be positive. She is having sob, hot and cold and very fatigue. Based on lab results, no covid result is in yet. There was a call made by Dr. Nolon Rod with other lab results. Pt says she has not been taking any medication for the symptoms she has been having, only the zoloft for depression. . Patient is a former patient of [name] PA-C Her labs were faxed here from [place] She has not been seen since June 2019  Left message for her to call to make an appointment with Maximiano Coss FNP She should also come in to discuss her results. Results sent for abstracting but the summary is as follows  TC 285 HDL 41 TG 241 LDL 199  GLUCOSE 122 Cr 1.2  Hgb 17.3 Hct 51.9

## 2019-03-25 ENCOUNTER — Telehealth: Payer: Self-pay

## 2019-03-25 NOTE — Telephone Encounter (Signed)
Pt called to office stating that labs recently sent to our office were sent in error.  Were ordered by another clinic and she is unsure why the were sent here.  Pt asks that we fax labs to another party.  Dr. Nolon Rod did note that labwork was received but also noted that patient has not been seen here recently.  Advised pt to contact the ordering provider and/or [facility] to have labs faxed to appropriate location.  Pt verbalizes understanding.

## 2019-04-07 DIAGNOSIS — J411 Mucopurulent chronic bronchitis: Secondary | ICD-10-CM | POA: Insufficient documentation

## 2019-04-21 ENCOUNTER — Encounter: Payer: BLUE CROSS/BLUE SHIELD | Admitting: Registered Nurse

## 2019-07-03 IMAGING — DX DG CERVICAL SPINE COMPLETE 4+V
5 series · 5 of 5 positions shown · non-contrast
Comparison: None

CLINICAL DATA: Numbness in LEFT hand, history of arthritis in back

EXAM:
CERVICAL SPINE - COMPLETE 4+ VIEW

[c-spine lat]
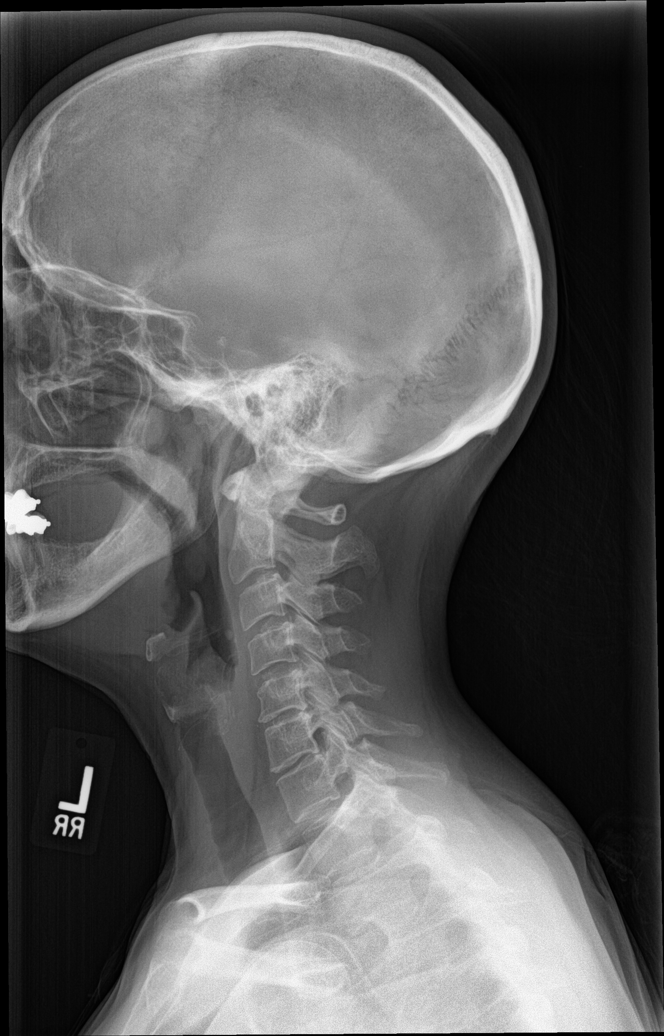

[c-spine obl]
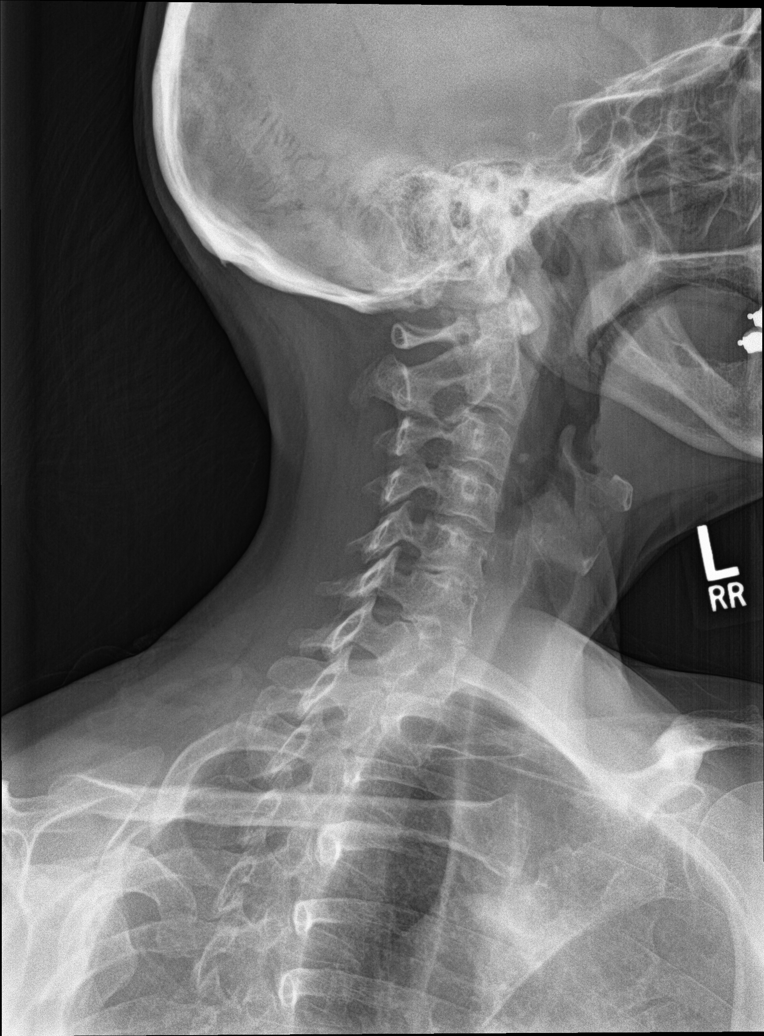

[c-spine ap]
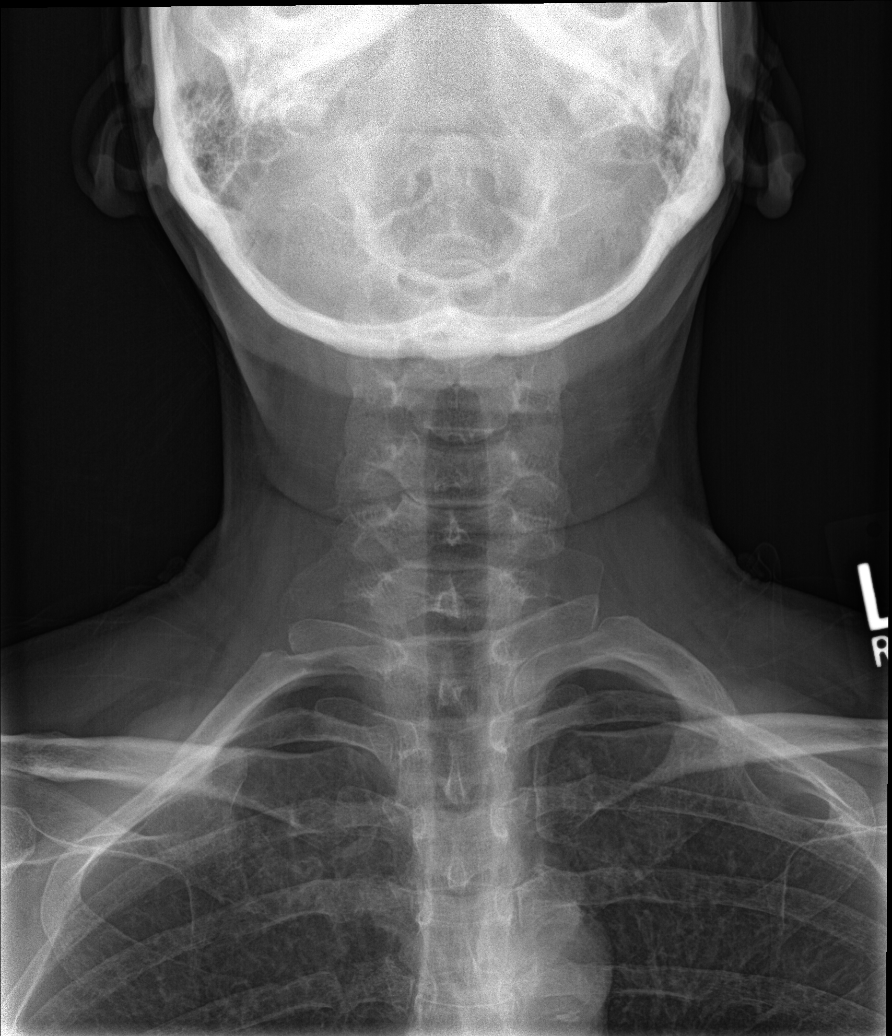

[c-spine open mouth]
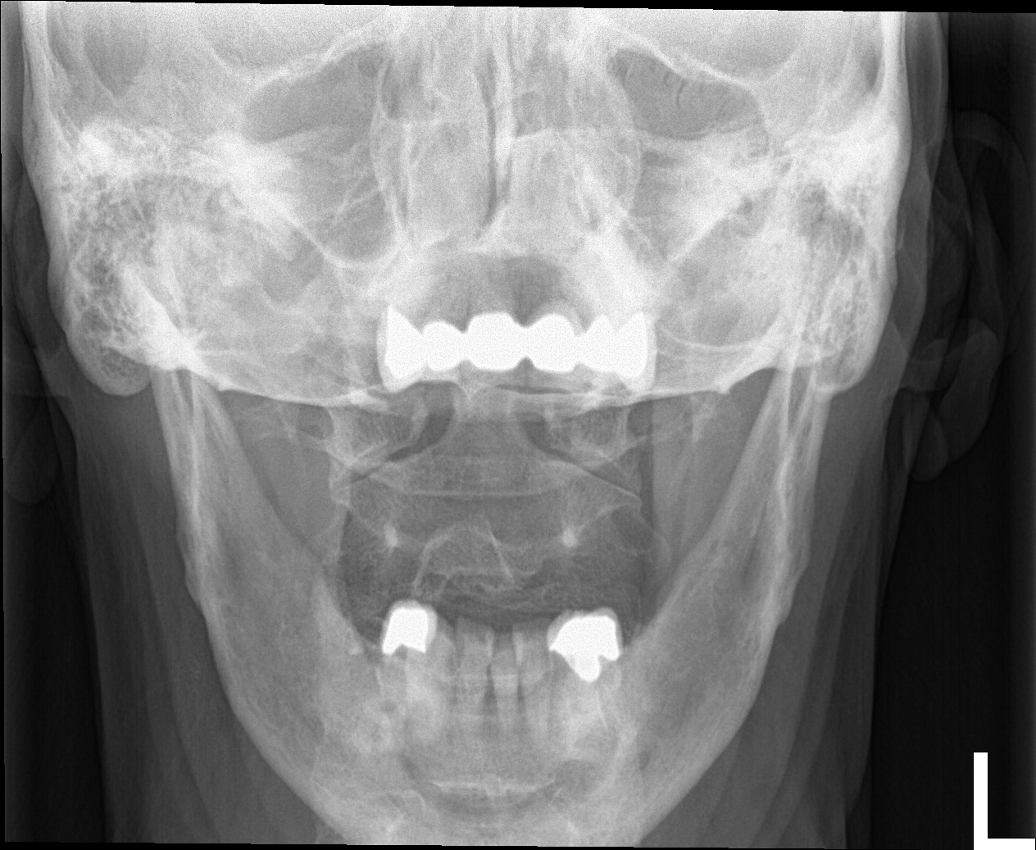

[[person_name]]
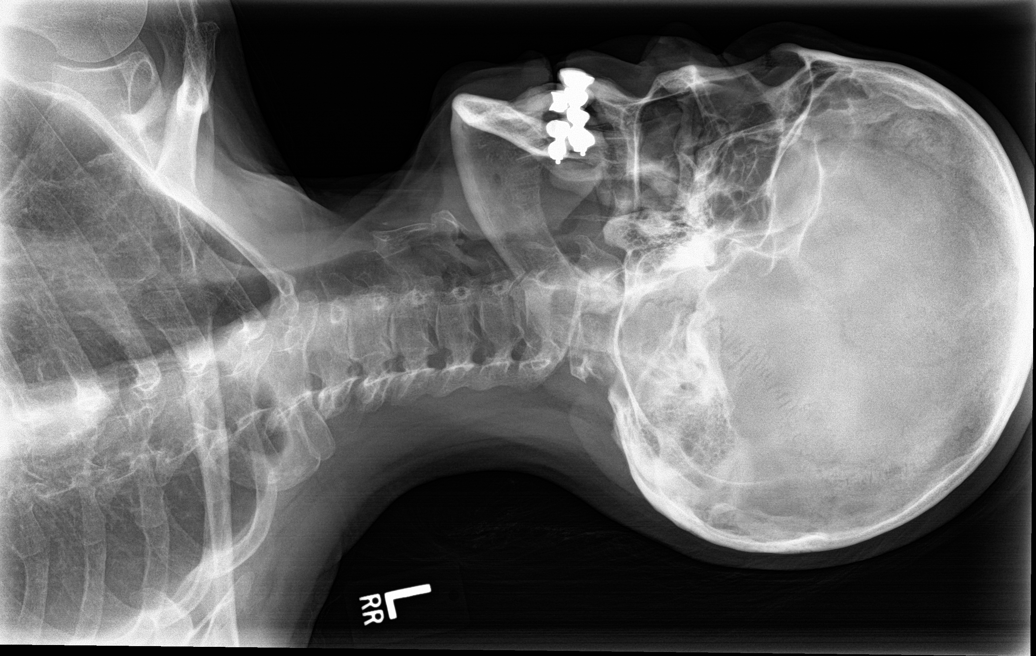

[5 of 5 positions shown; findings below may reference images not displayed]

FINDINGS: Prevertebral soft tissues normal thickness.

Osseous demineralization.

Disc space narrowing at C5-C6 and C6-C7.

Vertebral body heights maintained without fracture or subluxation.

Scattered facet degenerative changes.

Bony foramina patent.

C1-C2 alignment normal.

Lung apices clear.

Atherosclerotic calcification aortic arch.
IMPRESSION: Osseous demineralization with mild degenerative disc and facet
disease changes of cervical spine.

No acute cervical spine abnormalities.

## 2020-01-05 ENCOUNTER — Other Ambulatory Visit: Payer: Self-pay

## 2020-01-05 ENCOUNTER — Emergency Department (HOSPITAL_COMMUNITY)
Admission: EM | Admit: 2020-01-05 | Discharge: 2020-01-06 | Disposition: A | Discharge status: Home / Self Care | Payer: BLUE CROSS/BLUE SHIELD | Attending: Emergency Medicine | Admitting: Emergency Medicine

## 2020-01-05 ENCOUNTER — Encounter (HOSPITAL_COMMUNITY): Payer: Self-pay | Admitting: Emergency Medicine

## 2020-01-05 DIAGNOSIS — R441 Visual hallucinations: Secondary | ICD-10-CM | POA: Insufficient documentation

## 2020-01-05 DIAGNOSIS — F17210 Nicotine dependence, cigarettes, uncomplicated: Secondary | ICD-10-CM | POA: Diagnosis not present

## 2020-01-05 DIAGNOSIS — F333 Major depressive disorder, recurrent, severe with psychotic symptoms: Secondary | ICD-10-CM | POA: Insufficient documentation

## 2020-01-05 DIAGNOSIS — Z20822 Contact with and (suspected) exposure to covid-19: Secondary | ICD-10-CM | POA: Diagnosis not present

## 2020-01-05 DIAGNOSIS — Z79899 Other long term (current) drug therapy: Secondary | ICD-10-CM | POA: Diagnosis not present

## 2020-01-05 DIAGNOSIS — I10 Essential (primary) hypertension: Secondary | ICD-10-CM | POA: Diagnosis not present

## 2020-01-05 DIAGNOSIS — Z59819 Housing instability, housed unspecified: Secondary | ICD-10-CM

## 2020-01-05 DIAGNOSIS — Z599 Problem related to housing and economic circumstances, unspecified: Secondary | ICD-10-CM | POA: Diagnosis not present

## 2020-01-05 DIAGNOSIS — R443 Hallucinations, unspecified: Secondary | ICD-10-CM

## 2020-01-05 DIAGNOSIS — F99 Mental disorder, not otherwise specified: Secondary | ICD-10-CM

## 2020-01-05 LAB — COMPREHENSIVE METABOLIC PANEL
ALT: 22 U/L (ref 0–44)
AST: 27 U/L (ref 15–41)
Albumin: 4.4 g/dL (ref 3.5–5.0)
Alkaline Phosphatase: 107 U/L (ref 38–126)
Anion gap: 12 (ref 5–15)
BUN: 10 mg/dL (ref 6–20)
CO2: 24 mmol/L (ref 22–32)
Calcium: 9.7 mg/dL (ref 8.9–10.3)
Chloride: 95 mmol/L — ABNORMAL LOW (ref 98–111)
Creatinine, Ser: 1.22 mg/dL — ABNORMAL HIGH (ref 0.44–1.00)
GFR calc Af Amer: 56 mL/min — ABNORMAL LOW (ref >60)
GFR calc non Af Amer: 48 mL/min — ABNORMAL LOW (ref >60)
Glucose, Bld: 101 mg/dL — ABNORMAL HIGH (ref 70–99)
Potassium: 4 mmol/L (ref 3.5–5.1)
Sodium: 131 mmol/L — ABNORMAL LOW (ref 135–145)
Total Bilirubin: 1 mg/dL (ref 0.3–1.2)
Total Protein: 7 g/dL (ref 6.5–8.1)

## 2020-01-05 LAB — CBC
HCT: 46.7 % — ABNORMAL HIGH (ref 36.0–46.0)
Hemoglobin: 15.8 g/dL — ABNORMAL HIGH (ref 12.0–15.0)
MCH: 29.3 pg (ref 26.0–34.0)
MCHC: 33.8 g/dL (ref 30.0–36.0)
MCV: 86.6 fL (ref 80.0–100.0)
Platelets: 321 10*3/uL (ref 150–400)
RBC: 5.39 MIL/uL — ABNORMAL HIGH (ref 3.87–5.11)
RDW: 11.9 % (ref 11.5–15.5)
WBC: 12 10*3/uL — ABNORMAL HIGH (ref 4.0–10.5)
nRBC: 0 % (ref 0.0–0.2)

## 2020-01-05 LAB — RAPID URINE DRUG SCREEN, HOSP PERFORMED
Amphetamines: NOT DETECTED
Barbiturates: NOT DETECTED
Benzodiazepines: NOT DETECTED
Cocaine: NOT DETECTED
Opiates: NOT DETECTED
Tetrahydrocannabinol: NOT DETECTED

## 2020-01-05 LAB — I-STAT BETA HCG BLOOD, ED (MC, WL, AP ONLY): I-stat hCG, quantitative: 9.1 m[IU]/mL — ABNORMAL HIGH (ref <5)

## 2020-01-05 LAB — ETHANOL: Alcohol, Ethyl (B): <10 mg/dL (ref <10)

## 2020-01-05 NOTE — ED Triage Notes (Addendum)
Pt presents to ED POV.  Pt rambling in triage. States that she feels like she's "unravelling". Pt calm and cooperative

## 2020-01-06 LAB — SARS CORONAVIRUS 2 BY RT PCR (HOSPITAL ORDER, PERFORMED IN ~~LOC~~ HOSPITAL LAB): SARS Coronavirus 2: NEGATIVE

## 2020-01-06 MED ORDER — ROSUVASTATIN CALCIUM 5 MG PO TABS
20.0000 mg | ORAL_TABLET | Freq: Every day | ORAL | Status: DC
  Administered 2020-01-06: 20 mg via ORAL
  Filled 2020-01-06: qty 4

## 2020-01-06 MED ORDER — BUSPIRONE HCL 10 MG PO TABS
10.0000 mg | ORAL_TABLET | Freq: Every day | ORAL | Status: DC
  Administered 2020-01-06: 10 mg via ORAL
  Filled 2020-01-06: qty 1

## 2020-01-06 MED ORDER — LAMOTRIGINE 150 MG PO TABS
150.0000 mg | ORAL_TABLET | Freq: Every day | ORAL | Status: DC
  Administered 2020-01-06: 150 mg via ORAL
  Filled 2020-01-06: qty 1

## 2020-01-06 MED ORDER — LORATADINE 10 MG PO TABS: 10.0000 mg | ORAL_TABLET | Freq: Every day | ORAL | Status: DC | PRN

## 2020-01-06 MED ORDER — POLYVINYL ALCOHOL 1.4 % OP SOLN: 1.0000 [drp] | Freq: Three times a day (TID) | OPHTHALMIC | Status: DC | PRN

## 2020-01-06 MED ORDER — FERROUS SULFATE 325 (65 FE) MG PO TABS
325.0000 mg | ORAL_TABLET | Freq: Every day | ORAL | Status: DC
  Administered 2020-01-06: 325 mg via ORAL
  Filled 2020-01-06: qty 1
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
## 2020-01-06 NOTE — ED Notes (Signed)
Telepsych being performed. 

## 2020-01-06 NOTE — ED Notes (Signed)
D/C instructions given and questions answered to satisfaction - Resources discussed and given - ALL belongings - 2 labeled belongings bags, Valuables Envelope, and Home Meds - returned to pt - Pt signed verifying all items present.

## 2020-01-06 NOTE — ED Notes (Signed)
Advised pt of tx plan - d/c w/resources for shelters and w/UDS results so she can present them to [facility]. Pt states she still will not be able to return there and asked if we were going to provide housing. Advised pt resources for shelters/housing will be given and she may contact whichever one(s) she chooses. States she brings home $1500/month and she is having difficulty making this money last the whole month.
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
## 2020-01-06 NOTE — Discharge Instructions (Addendum)
Please follow up with psychatry  Return for new suicidal thoughts or plan to harm yourself or others

## 2020-01-06 NOTE — ED Provider Notes (Signed)
1340: I was asked by RN to discharge this patient.  Please see previous documentation from ED provider and psych NP for full details.  I have reviewed patient's chart, nursing notes and psych notes.  She was medically cleared this morning.  TTS and NP psych has evaluated patient and she denied current SI with plan or intent, HI or psychosis.  It appears patient's main concern was housing.  Psychiatry has deemed her appropriate for discharge as she does not present any evidence of imminent risk to self or others.  Psych has spoken to CSW for social work resources.  Physical Exam  BP (!) 143/80 (BP Location: Right Arm)   Pulse 93   Temp 98.1 F (36.7 C) (Oral)   Resp 18   SpO2 100%   ED Course/Procedures     Procedures  MDM   I have evaluated patient.  She denies SI, HI to me as well. Discussed with her psychiatry has deemed her appropriate for discharge at this time.  Affect seems appropriate.  Vital signs normal.  Appropriate discharge.      Kinnie Feil, PA-C 01/06/20 1342    Charlesetta Shanks, MD 01/15/20 [phone]

## 2020-01-06 NOTE — ED Notes (Signed)
Pt took meds w/o difficulty. Pt states she is feeling better now - Eating snack.

## 2020-01-06 NOTE — ED Notes (Signed)
Breakfast Ordered 

## 2020-01-06 NOTE — ED Notes (Signed)
Pt reports she stays at an [facility] but does not know the number to the person [facility] SW may contact to see if she can return. BH is recommending to d/c pt w/housing resources.

## 2020-01-08 ENCOUNTER — Emergency Department (HOSPITAL_COMMUNITY)
Admission: EM | Admit: 2020-01-08 | Discharge: 2020-01-09 | Disposition: A | Discharge status: Other Acute Inpatient Hospital | Payer: BLUE CROSS/BLUE SHIELD | Attending: Emergency Medicine | Admitting: Emergency Medicine

## 2020-01-08 ENCOUNTER — Encounter (HOSPITAL_COMMUNITY): Payer: Self-pay | Admitting: Emergency Medicine

## 2020-01-08 ENCOUNTER — Other Ambulatory Visit: Payer: Self-pay

## 2020-01-08 DIAGNOSIS — Z20822 Contact with and (suspected) exposure to covid-19: Secondary | ICD-10-CM | POA: Diagnosis not present

## 2020-01-08 DIAGNOSIS — I10 Essential (primary) hypertension: Secondary | ICD-10-CM | POA: Insufficient documentation

## 2020-01-08 DIAGNOSIS — F17210 Nicotine dependence, cigarettes, uncomplicated: Secondary | ICD-10-CM | POA: Insufficient documentation

## 2020-01-08 DIAGNOSIS — F332 Major depressive disorder, recurrent severe without psychotic features: Secondary | ICD-10-CM

## 2020-01-08 DIAGNOSIS — R441 Visual hallucinations: Secondary | ICD-10-CM | POA: Diagnosis present

## 2020-01-08 DIAGNOSIS — Z79899 Other long term (current) drug therapy: Secondary | ICD-10-CM | POA: Diagnosis not present

## 2020-01-08 DIAGNOSIS — F315 Bipolar disorder, current episode depressed, severe, with psychotic features: Secondary | ICD-10-CM | POA: Diagnosis not present

## 2020-01-08 DIAGNOSIS — F331 Major depressive disorder, recurrent, moderate: Secondary | ICD-10-CM | POA: Diagnosis present

## 2020-01-08 DIAGNOSIS — E785 Hyperlipidemia, unspecified: Secondary | ICD-10-CM | POA: Insufficient documentation

## 2020-01-08 DIAGNOSIS — R443 Hallucinations, unspecified: Secondary | ICD-10-CM

## 2020-01-08 LAB — RAPID URINE DRUG SCREEN, HOSP PERFORMED
Amphetamines: NOT DETECTED
Barbiturates: NOT DETECTED
Benzodiazepines: NOT DETECTED
Cocaine: NOT DETECTED
Opiates: NOT DETECTED
Tetrahydrocannabinol: NOT DETECTED

## 2020-01-08 LAB — CBC WITH DIFFERENTIAL/PLATELET
Abs Immature Granulocytes: 0.02 10*3/uL (ref 0.00–0.07)
Basophils Absolute: 0 10*3/uL (ref 0.0–0.1)
Basophils Relative: 0 %
Eosinophils Absolute: 0.2 10*3/uL (ref 0.0–0.5)
Eosinophils Relative: 2 %
HCT: 48 % — ABNORMAL HIGH (ref 36.0–46.0)
Hemoglobin: 16.4 g/dL — ABNORMAL HIGH (ref 12.0–15.0)
Immature Granulocytes: 0 %
Lymphocytes Relative: 41 %
Lymphs Abs: 3.8 10*3/uL (ref 0.7–4.0)
MCH: 29.4 pg (ref 26.0–34.0)
MCHC: 34.2 g/dL (ref 30.0–36.0)
MCV: 86.2 fL (ref 80.0–100.0)
Monocytes Absolute: 0.7 10*3/uL (ref 0.1–1.0)
Monocytes Relative: 8 %
Neutro Abs: 4.6 10*3/uL (ref 1.7–7.7)
Neutrophils Relative %: 49 %
Platelets: 320 10*3/uL (ref 150–400)
RBC: 5.57 MIL/uL — ABNORMAL HIGH (ref 3.87–5.11)
RDW: 11.8 % (ref 11.5–15.5)
WBC: 9.3 10*3/uL (ref 4.0–10.5)
nRBC: 0 % (ref 0.0–0.2)

## 2020-01-08 LAB — COMPREHENSIVE METABOLIC PANEL
ALT: 23 U/L (ref 0–44)
AST: 23 U/L (ref 15–41)
Albumin: 4.7 g/dL (ref 3.5–5.0)
Alkaline Phosphatase: 114 U/L (ref 38–126)
Anion gap: 12 (ref 5–15)
BUN: 6 mg/dL (ref 6–20)
CO2: 25 mmol/L (ref 22–32)
Calcium: 9.6 mg/dL (ref 8.9–10.3)
Chloride: 103 mmol/L (ref 98–111)
Creatinine, Ser: 1.04 mg/dL — ABNORMAL HIGH (ref 0.44–1.00)
GFR calc Af Amer: >60 mL/min (ref >60)
GFR calc non Af Amer: 58 mL/min — ABNORMAL LOW (ref >60)
Glucose, Bld: 112 mg/dL — ABNORMAL HIGH (ref 70–99)
Potassium: 3.2 mmol/L — ABNORMAL LOW (ref 3.5–5.1)
Sodium: 140 mmol/L (ref 135–145)
Total Bilirubin: 0.6 mg/dL (ref 0.3–1.2)
Total Protein: 7.5 g/dL (ref 6.5–8.1)

## 2020-01-08 LAB — ETHANOL: Alcohol, Ethyl (B): <10 mg/dL (ref <10)

## 2020-01-08 LAB — ACETAMINOPHEN LEVEL: Acetaminophen (Tylenol), Serum: <10 ug/mL — ABNORMAL LOW (ref 10–30)

## 2020-01-08 LAB — SALICYLATE LEVEL: Salicylate Lvl: <7 mg/dL — ABNORMAL LOW (ref 7.0–30.0)

## 2020-01-08 LAB — SARS CORONAVIRUS 2 BY RT PCR (HOSPITAL ORDER, PERFORMED IN ~~LOC~~ HOSPITAL LAB): SARS Coronavirus 2: NEGATIVE

## 2020-01-08 MED ORDER — NALOXONE HCL 0.4 MG/ML IJ SOLN
0.4000 mg | Freq: Once | INTRAMUSCULAR | Status: AC
  Administered 2020-01-08: 0.4 mg via INTRAVENOUS
  Filled 2020-01-08: qty 1

## 2020-01-08 MED ORDER — SODIUM CHLORIDE 0.9 % IV BOLUS
1000.0000 mL | Freq: Once | INTRAVENOUS | Status: AC
  Administered 2020-01-08: 1000 mL via INTRAVENOUS

## 2020-01-08 MED ORDER — LAMOTRIGINE 25 MG PO TABS
150.0000 mg | ORAL_TABLET | Freq: Every day | ORAL | Status: DC
  Administered 2020-01-09 (×2): 150 mg via ORAL
  Filled 2020-01-08 (×2): qty 2

## 2020-01-08 MED ORDER — ROSUVASTATIN CALCIUM 20 MG PO TABS
20.0000 mg | ORAL_TABLET | Freq: Every day | ORAL | Status: DC
  Administered 2020-01-09: 20 mg via ORAL
  Filled 2020-01-08: qty 1

## 2020-01-08 MED ORDER — LORAZEPAM 1 MG PO TABS: 1.0000 mg | ORAL_TABLET | ORAL | Status: DC | PRN

## 2020-01-08 MED ORDER — BUSPIRONE HCL 10 MG PO TABS
10.0000 mg | ORAL_TABLET | Freq: Every day | ORAL | Status: DC
  Administered 2020-01-09 (×2): 10 mg via ORAL
  Filled 2020-01-08 (×2): qty 1
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
## 2020-01-08 NOTE — ED Provider Notes (Signed)
[facility] DEPT Provider Note   CSN: [id number] Arrival date & time: 01/08/20  1528     History Chief Complaint  Patient presents with  . Hallucinations    Erica Bradley is a 59 y.o. female history of hyperlipidemia, depression, here presenting with hallucinations.  Patient is living at [facility] right now.  She apparently was doing some drugs and was staggering in the hallway.  She was noted to have some visual hallucinations.  Patient told me that she is seeing birds and they are floating in the air.  Patient states that she is depressed but denies any plan to kill herself. Patient was seen here 2 days ago and was cleared by psychiatry.  Patient was thought to have more social issue at that time and social work was able to get her to a motel.   The history is provided by the patient. The history is limited by the condition of the patient.  Level V caveat- AMS      Past Medical History:  Diagnosis Date  . Back complaints   . Depression   . GERD (gastroesophageal reflux disease)    denies meds  . Hyperlipidemia   . Shortness of breath dyspnea    "because I smoke"  . Stroke [facility]) 2005   no residual per patient    Patient Active Problem List   Diagnosis Date Noted  . Essential hypertension, benign 11/25/2013  . Abdominal pain, epigastric 11/18/2013  . Dermatitis 10/28/2013  . Carotid stenosis 10/28/2013  . Elevated blood pressure 10/28/2013  . Anemia 10/28/2013  . TIA (transient ischemic attack) 10/28/2013  . Hyperlipidemia 10/28/2013  . Personal history of colonic polyps 10/28/2013    Past Surgical History:  Procedure Laterality Date  . ABDOMINAL HYSTERECTOMY    . CARPAL TUNNEL RELEASE Left 08/26/2017   Procedure: Carpal Tunnel Release, Distal Nerve Transfer;  Surgeon: [name], MD;  Location: [place];  Service: Orthopedics;  Laterality: Left;  . CHOLECYSTECTOMY    . EYE SURGERY     x 3   . STRABISMUS SURGERY  Bilateral 08/04/2015   Procedure: REPAIR STRABISMUS BILATERAL;  Surgeon: [name], MD;  Location: [place];  Service: Ophthalmology;  Laterality: Bilateral;  . TUBAL LIGATION  1997  . ULNAR NERVE TRANSPOSITION Left 08/26/2017   Procedure: Left Ulnar Neuroplasty at the Elbow;  Surgeon: [name], MD;  Location: [place];  Service: Orthopedics;  Laterality: Left;  GENERAL ANESTHESIA WITH PRE-OP BLOCK     OB History   No obstetric history on file.     Family History  Problem Relation Age of Onset  . Cancer Mother        lung ca  . Depression Mother   . Hyperlipidemia Mother   . Hypertension Other     Social History   Tobacco Use  . Smoking status: Current Every Day Smoker    Packs/day: 1.00    Types: Cigarettes  . Smokeless tobacco: Never Used  Substance Use Topics  . Alcohol use: No  . Drug use: No    Home Medications Prior to Admission medications   Medication Sig Start Date End Date Taking? Authorizing Provider  busPIRone (BUSPAR) 10 MG tablet Take 1 tablet by mouth daily. 05/12/17   [provider]  carboxymethylcellulose (REFRESH PLUS) 0.5 % SOLN Place 1 drop into both eyes 3 (three) times daily as needed (Dry).    [provider]  ferrous sulfate 325 (65  FE) MG tablet Take 1 tablet by mouth daily.    [provider]  lamoTRIgine (LAMICTAL) 150 MG tablet Take 1 tablet by mouth daily.    [provider]  lamoTRIgine (LAMICTAL) 25 MG tablet Take 50 mg by mouth daily. 01/05/20   [provider]  levocetirizine (XYZAL) 5 MG tablet Take 1 tablet by mouth daily as needed for allergies.    [provider]  rosuvastatin (CRESTOR) 20 MG tablet Take 1 tablet by mouth daily. 10/19/19   [provider]    Allergies    Sulfur  Review of Systems   Review of Systems  Psychiatric/Behavioral: Positive for hallucinations.  All other systems reviewed and are negative.   Physical  Exam Updated Vital Signs BP 140/81   Pulse 89   Temp 98.2 F (36.8 C) (Oral)   Resp (!) 21   SpO2 97%   Physical Exam Vitals and nursing note reviewed.  Constitutional:      Comments: Confused, sleepy, difficult to arouse   HENT:     Head: Normocephalic.     Mouth/Throat:     Mouth: Mucous membranes are moist.  Eyes:     Extraocular Movements: Extraocular movements intact.     Comments: Pinpoint pupils   Cardiovascular:     Rate and Rhythm: Normal rate.     Pulses: Normal pulses.     Heart sounds: Normal heart sounds.  Pulmonary:     Effort: Pulmonary effort is normal.  Abdominal:     General: Abdomen is flat.  Musculoskeletal:        General: Normal range of motion.     Cervical back: Normal range of motion.  Skin:    General: Skin is warm.     Capillary Refill: Capillary refill takes less than 2 seconds.  Neurological:     General: No focal deficit present.     Comments: Sleepy, moving all extremities   Psychiatric:     Comments: Depressed      ED Results / Procedures / Treatments   Labs (all labs ordered are listed, but only abnormal results are displayed) Labs Reviewed  CBC WITH DIFFERENTIAL/PLATELET - Abnormal; Notable for the following components:      Result Value   RBC 5.57 (*)    Hemoglobin 16.4 (*)    HCT 48.0 (*)    All other components within normal limits  COMPREHENSIVE METABOLIC PANEL - Abnormal; Notable for the following components:   Potassium 3.2 (*)    Glucose, Bld 112 (*)    Creatinine, Ser 1.04 (*)    GFR calc non Af Amer 58 (*)    All other components within normal limits  SALICYLATE LEVEL - Abnormal; Notable for the following components:   Salicylate Lvl [id number] (*)    All other components within normal limits  ACETAMINOPHEN LEVEL - Abnormal; Notable for the following components:   Acetaminophen (Tylenol), Serum <10 (*)    All other components within normal limits  SARS CORONAVIRUS 2 BY RT PCR (HOSPITAL ORDER, [place] [facility])  ETHANOL  RAPID URINE DRUG SCREEN, HOSP PERFORMED    EKG None  Radiology No results found.  Procedures Procedures (including critical care time)  Medications Ordered in ED Medications  busPIRone (BUSPAR) tablet 10 mg (has no administration in time range)  lamoTRIgine (LAMICTAL) tablet 150 mg (has no administration in time range)  rosuvastatin (CRESTOR) tablet 20 mg (has no administration in time range)  LORazepam (ATIVAN) tablet 1 mg (  has no administration in time range)  sodium chloride 0.9 % bolus 1,000 mL (0 mLs Intravenous Stopped 01/08/20 1717)  naloxone (NARCAN) injection 0.4 mg (0.4 mg Intravenous Given 01/08/20 1551)    ED Course  I have reviewed the triage vital signs and the nursing notes.  Pertinent labs & imaging results that were available during my care of the patient were reviewed by me and considered in my medical decision making (see chart for details).    MDM Rules/Calculators/A&P                      Erica Bradley is a 59 y.o. female who presented with hallucinations.  Patient initially was not talking much.  After she was given Narcan, she became very tearful and is responding to internal stimuli.  Initially social work was consulted.  Psychiatry saw patient and recommended inpatient psych admission.  She is medically cleared and home meds were ordered.  Final Clinical Impression(s) / ED Diagnoses Final diagnoses:  None    Rx / DC Orders ED Discharge Orders    None       Drenda Freeze, MD 01/08/20 2051
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
## 2020-01-08 NOTE — ED Notes (Signed)
Pt refused to dress in burgundy scrubs at this time. Pt states "I don't want you touching me until I know how long I'm staying here." RN notified.

## 2020-01-08 NOTE — ED Notes (Signed)
ED Provider at bedside. 

## 2020-01-08 NOTE — ED Notes (Signed)
Pt is changed in to burgundy scrubs

## 2020-01-09 ENCOUNTER — Observation Stay (HOSPITAL_COMMUNITY)
Admission: AD | Admit: 2020-01-09 | Discharge: 2020-01-10 | Disposition: A | Discharge status: Home / Self Care | Payer: BLUE CROSS/BLUE SHIELD | Source: Intra-hospital | Attending: Psychiatry | Admitting: Psychiatry

## 2020-01-09 ENCOUNTER — Encounter (HOSPITAL_COMMUNITY): Payer: Self-pay | Admitting: Psychiatry

## 2020-01-09 DIAGNOSIS — F332 Major depressive disorder, recurrent severe without psychotic features: Secondary | ICD-10-CM | POA: Insufficient documentation

## 2020-01-09 DIAGNOSIS — F331 Major depressive disorder, recurrent, moderate: Secondary | ICD-10-CM | POA: Diagnosis present

## 2020-01-09 MED ORDER — LORATADINE 10 MG PO TABS
10.0000 mg | ORAL_TABLET | Freq: Every day | ORAL | Status: DC
  Administered 2020-01-10: 10 mg via ORAL
  Filled 2020-01-09: qty 1

## 2020-01-09 MED ORDER — BUSPIRONE HCL 5 MG PO TABS: 10.0000 mg | ORAL_TABLET | Freq: Every day | ORAL | Status: DC

## 2020-01-09 MED ORDER — LEVOCETIRIZINE DIHYDROCHLORIDE 5 MG PO TABS: 5.0000 mg | ORAL_TABLET | Freq: Every day | ORAL | Status: DC | PRN

## 2020-01-09 MED ORDER — ROSUVASTATIN CALCIUM 5 MG PO TABS
20.0000 mg | ORAL_TABLET | Freq: Every day | ORAL | Status: DC
  Administered 2020-01-10: 20 mg via ORAL
  Filled 2020-01-09: qty 4

## 2020-01-09 MED ORDER — ROSUVASTATIN CALCIUM 5 MG PO TABS: 20.0000 mg | ORAL_TABLET | Freq: Every day | ORAL | Status: DC

## 2020-01-09 MED ORDER — BUSPIRONE HCL 5 MG PO TABS
10.0000 mg | ORAL_TABLET | Freq: Two times a day (BID) | ORAL | Status: DC
  Administered 2020-01-09 – 2020-01-10 (×2): 10 mg via ORAL
  Filled 2020-01-09 (×2): qty 2

## 2020-01-09 MED ORDER — LAMOTRIGINE 25 MG PO TABS
25.0000 mg | ORAL_TABLET | Freq: Every day | ORAL | Status: DC
  Administered 2020-01-10: 25 mg via ORAL
  Filled 2020-01-09: qty 1

## 2020-01-09 MED ORDER — FERROUS SULFATE 325 (65 FE) MG PO TABS
325.0000 mg | ORAL_TABLET | Freq: Every day | ORAL | Status: DC
  Administered 2020-01-10: 325 mg via ORAL
  Filled 2020-01-09 (×5): qty 1

## 2020-01-09 NOTE — ED Notes (Signed)
Pt crying, very upset,states "she needs help", "very sad", "mood swigs are terrible". Pt reassured
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
## 2020-01-09 NOTE — Progress Notes (Signed)
Pt has been accepted to [facility] OBS unit bed 206 and can arrive anytime after 1:00PM. Accepting provider: Waylan Boga. Attending: Dr. Darleene Cleaver MD. Pt is COVID negative. This is a voluntary admit. Number for report: [phone]. CSW notified [facility] social work: [phone] of disposition.    Raymont Andreoni S. Ouida Sills, MSW, LCSW Clinical Social Worker 01/09/2020 12:37 PM

## 2020-01-09 NOTE — ED Notes (Signed)
Pt to [facility] Obs Unit per provider. Pt alert, calm, cooperative, tearful, no s/s of distress. DC information given to [employer] for facility. Belongings given to [employer] for facility. Pt ambulatory off unit by RN , pt transported by [employer].

## 2020-01-09 NOTE — Plan of Care (Signed)
[facility] Crisis Plan  Reason for Crisis Plan:  Crisis Stabilization   Plan of Care:  Referral for Inpatient Hospitalization  Family Support:      Current Living Environment:  Living Arrangements: Other (Comment)(Homeless)  Insurance:   Hospital Account    Name Acct ID Class Status Primary Coverage   Erica Bradley, Erica Bradley [id number] [facility] NON-PARTICIPATING        Guarantor Account (for Hospital Account [id number])    Name Relation to Pt Service Area Active? Acct Type   Erica Bradley Self [facility] Yes Behavioral Health   Address Phone       [address] [place], [place] [address] [phone])          Coverage Information (for Hospital Account [id number])    F/O Payor/Plan Precert #   [facility] SHIELD/[facility]BS[facility] NON-PARTICIPATING    Subscriber Subscriber #   Erica Bradley, Erica Bradley [id number]   Address Phone   [address] [place], [place] [address] [phone]      Legal Guardian:     Primary Care Provider:  Patient, No Pcp Per  Current Outpatient Providers:  Sees a Provider in [place]  Psychiatrist:     Counselor/Therapist:     Compliant with Medications:  No  Additional Information:   Erica Bradley 5/23/20214:56 PM

## 2020-01-09 NOTE — Progress Notes (Signed)
RN updated pt accepted to [facility] and can go  At 1pm  CSW will continue to follow for D/C needs.  Alphonse Guild. Tracen Mahler  MSW, LCSW, LCAS, CCS Transitions of Care Clinical Social Worker Care Coordination Department Ph: [phone]

## 2020-01-10 ENCOUNTER — Ambulatory Visit (HOSPITAL_COMMUNITY): Payer: Self-pay

## 2020-01-10 DIAGNOSIS — F329 Major depressive disorder, single episode, unspecified: Secondary | ICD-10-CM

## 2020-01-10 MED ORDER — BUSPIRONE HCL 10 MG PO TABS: 10.0000 mg | ORAL_TABLET | Freq: Two times a day (BID) | ORAL | 0 refills | Status: DC

## 2020-01-10 MED ORDER — IBUPROFEN 600 MG PO TABS
600.0000 mg | ORAL_TABLET | Freq: Four times a day (QID) | ORAL | Status: DC | PRN
  Filled 2020-01-10: qty 1
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
## 2020-01-10 NOTE — Progress Notes (Signed)
Pharmacy:  Behavioral Health meds  Verified behavioral health meds with [facility] [phone] lamictal 25 mg daily x 14 days then increase to 50mg  started 01/05/20 Nortriptyline 25 mg bid and 50 hs  Einar Grad, Pharm D

## 2020-01-10 NOTE — Progress Notes (Signed)
Patient A&O x 4, ambulatory. Patient discharged, anxious about going to [facility] Spoke with Peer Support, resources given. Patient denied SI/HI, A/VH upon discharge. Patient verbalized understanding of all discharge instructions explained by staff, to include follow up appointments, RX's and safety plan. Pt belongings returned to patient from locker #55  intact and belonging sheet signed. Patient discharged to lobby whjere patient's son was waiting to transport patient to her location. Goals met, safety maintained.

## 2020-01-10 NOTE — BH Assessment (Addendum)
[place] Assessment Progress Note  Per Myles Lipps, MD, this pt does not require psychiatric hospitalization at this time.  Pt is to be discharged from the [facility] Observation Unit with recommendation to continue treatment with [facility] in [facility].  This has been included in pt's discharge instructions.  Pt's nurse, Baruch Goldmann, has been notified.  Jalene Mullet, MA Triage Specialist [phone]   Addendum:  Per Dr Karmen Stabs request, information regarding area supportive services for the homeless has been added to pt's discharge instructions.  Baruch Goldmann has been notified.  Jalene Mullet, [place] Coordinator [phone]
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
## 2020-01-10 NOTE — Patient Outreach (Signed)
CPSS met with Pt an was able to gain information to better assist Pt. CPSS was made aware that Pt does not want to be put back on the streets. CPSS discussed several options for Pt ranging from shelters to assistant living facilities. CPSS talked with Pt about [employer] and what services they have to offer. Pt stated that she has a car an that she is willing to driver herself today. CPSS gave Pt contact information as well as directions to facility.

## 2020-01-10 NOTE — Discharge Instructions (Addendum)
For your behavioral health needs, you are advised to continue treatment with [facility]:       [facility]      [address]      [place], [place] [address]       [phone]  For your shelter needs, you are advised to contact [facility] at your earliest opportunity:       [facility]      [phone]  For other supportive services, contact the [place]:       [facility]      [place], [place] [address]      [phone]

## 2020-01-12 DIAGNOSIS — F411 Generalized anxiety disorder: Secondary | ICD-10-CM | POA: Insufficient documentation

## 2020-02-16 DIAGNOSIS — F17200 Nicotine dependence, unspecified, uncomplicated: Secondary | ICD-10-CM | POA: Insufficient documentation

## 2020-02-16 DIAGNOSIS — D582 Other hemoglobinopathies: Secondary | ICD-10-CM | POA: Insufficient documentation

## 2020-02-16 DIAGNOSIS — L578 Other skin changes due to chronic exposure to nonionizing radiation: Secondary | ICD-10-CM | POA: Insufficient documentation

## 2020-02-16 DIAGNOSIS — L821 Other seborrheic keratosis: Secondary | ICD-10-CM | POA: Insufficient documentation

## 2020-02-16 DIAGNOSIS — Z808 Family history of malignant neoplasm of other organs or systems: Secondary | ICD-10-CM | POA: Insufficient documentation

## 2020-11-01 ENCOUNTER — Emergency Department (HOSPITAL_COMMUNITY)
Admission: EM | Admit: 2020-11-01 | Discharge: 2020-11-02 | Disposition: A | Discharge status: Home / Self Care | Payer: 59 | Attending: Emergency Medicine | Admitting: Emergency Medicine

## 2020-11-01 ENCOUNTER — Emergency Department (HOSPITAL_COMMUNITY): Payer: 59

## 2020-11-01 ENCOUNTER — Encounter (HOSPITAL_COMMUNITY): Payer: Self-pay | Admitting: Emergency Medicine

## 2020-11-01 ENCOUNTER — Other Ambulatory Visit: Payer: Self-pay

## 2020-11-01 DIAGNOSIS — Z8673 Personal history of transient ischemic attack (TIA), and cerebral infarction without residual deficits: Secondary | ICD-10-CM | POA: Diagnosis not present

## 2020-11-01 DIAGNOSIS — R079 Chest pain, unspecified: Secondary | ICD-10-CM | POA: Insufficient documentation

## 2020-11-01 DIAGNOSIS — R0789 Other chest pain: Secondary | ICD-10-CM

## 2020-11-01 DIAGNOSIS — F17210 Nicotine dependence, cigarettes, uncomplicated: Secondary | ICD-10-CM | POA: Diagnosis not present

## 2020-11-01 DIAGNOSIS — I10 Essential (primary) hypertension: Secondary | ICD-10-CM | POA: Diagnosis not present

## 2020-11-01 DIAGNOSIS — R030 Elevated blood-pressure reading, without diagnosis of hypertension: Secondary | ICD-10-CM

## 2020-11-01 LAB — BASIC METABOLIC PANEL
Anion gap: 10 (ref 5–15)
BUN: 8 mg/dL (ref 8–23)
CO2: 21 mmol/L — ABNORMAL LOW (ref 22–32)
Calcium: 9.7 mg/dL (ref 8.9–10.3)
Chloride: 107 mmol/L (ref 98–111)
Creatinine, Ser: 0.98 mg/dL (ref 0.44–1.00)
GFR, Estimated: >60 mL/min (ref >60)
Glucose, Bld: 113 mg/dL — ABNORMAL HIGH (ref 70–99)
Potassium: 3.8 mmol/L (ref 3.5–5.1)
Sodium: 138 mmol/L (ref 135–145)

## 2020-11-01 LAB — CBC
HCT: 48.1 % — ABNORMAL HIGH (ref 36.0–46.0)
Hemoglobin: 15.9 g/dL — ABNORMAL HIGH (ref 12.0–15.0)
MCH: 30.2 pg (ref 26.0–34.0)
MCHC: 33.1 g/dL (ref 30.0–36.0)
MCV: 91.3 fL (ref 80.0–100.0)
Platelets: 335 10*3/uL (ref 150–400)
RBC: 5.27 MIL/uL — ABNORMAL HIGH (ref 3.87–5.11)
RDW: 12.2 % (ref 11.5–15.5)
WBC: 10.6 10*3/uL — ABNORMAL HIGH (ref 4.0–10.5)
nRBC: 0 % (ref 0.0–0.2)

## 2020-11-01 LAB — TROPONIN I (HIGH SENSITIVITY): Troponin I (High Sensitivity): 4 ng/L (ref <18)

## 2020-11-01 NOTE — ED Triage Notes (Signed)
Pt c/o left sided chest pain that started about an hour ago. Pt also reports she has been having intermittent left arm pain the last few days.

## 2020-11-02 LAB — TROPONIN I (HIGH SENSITIVITY): Troponin I (High Sensitivity): 4 ng/L (ref <18)

## 2020-11-02 MED ORDER — IBUPROFEN 600 MG PO TABS
600.0000 mg | ORAL_TABLET | Freq: Four times a day (QID) | ORAL | 0 refills | Status: DC | PRN
Start: 2020-11-02 — End: 2022-10-01

## 2020-11-02 NOTE — Discharge Instructions (Addendum)
Your tests here tonight are are negative. There is no evidence of heart attack, infection, or clots. Your exam shows tenderness of the chest wall, which is being treated with ibuprofen and warm compresses. It is important that you follow up with your doctor for recheck in one week to insure your symptoms are improving.   Your blood pressure was mildly elevated tonight. Your doctor will need to recheck this at the time you see them in one week.

## 2020-11-02 NOTE — ED Provider Notes (Signed)
[facility] EMERGENCY DEPARTMENT Provider Note   CSN: [id number] Arrival date & time: 11/01/20  2208     History Chief Complaint  Patient presents with  . Chest Pain    Erica Bradley is a 60 y.o. female.  Patient with a history of depression, HLD, smoker presents for evaluation of pain in her left chest that started while at rest last evening, about 1 hour before coming to the hospital. The pain was constant until around 3:00 am, went away, and returned a few minutes later, and since returning has been intermittent, returning every few minutes. It is no worse with exertion. She did not have any SOB, nausea, diaphoresis at any time. No recent illness, cough, congestion or fever. She has noticed an ache in her left arm for the past 1-2 days that is new for her. No neck pain, weakness or numbness. She reports a [facility] of CAD in both parents after the age of 64.  The history is provided by the patient. No language interpreter was used.  Chest Pain Associated symptoms: no abdominal pain, no cough, no diaphoresis, no fever, no nausea, no shortness of breath and no weakness        Past Medical History:  Diagnosis Date  . Back complaints   . Depression   . GERD (gastroesophageal reflux disease)    denies meds  . Hyperlipidemia   . Shortness of breath dyspnea    "because I smoke"  . Stroke [facility]) 2005   no residual per patient    Patient Active Problem List   Diagnosis Date Noted  . Major depressive disorder, recurrent episode, moderate ([place]) 01/09/2020  . Essential hypertension, benign 11/25/2013  . Abdominal pain, epigastric 11/18/2013  . Dermatitis 10/28/2013  . Carotid stenosis 10/28/2013  . Elevated blood pressure 10/28/2013  . Anemia 10/28/2013  . TIA (transient ischemic attack) 10/28/2013  . Hyperlipidemia 10/28/2013  . Personal history of colonic polyps 10/28/2013    Past Surgical History:  Procedure Laterality Date  . ABDOMINAL HYSTERECTOMY    .  CARPAL TUNNEL RELEASE Left 08/26/2017   Procedure: Carpal Tunnel Release, Distal Nerve Transfer;  Surgeon: [name], MD;  Location: [place];  Service: Orthopedics;  Laterality: Left;  . CHOLECYSTECTOMY    . EYE SURGERY     x 3   . STRABISMUS SURGERY Bilateral 08/04/2015   Procedure: REPAIR STRABISMUS BILATERAL;  Surgeon: [name], MD;  Location: [place];  Service: Ophthalmology;  Laterality: Bilateral;  . TUBAL LIGATION  1997  . ULNAR NERVE TRANSPOSITION Left 08/26/2017   Procedure: Left Ulnar Neuroplasty at the Elbow;  Surgeon: [name], MD;  Location: [place];  Service: Orthopedics;  Laterality: Left;  GENERAL ANESTHESIA WITH PRE-OP BLOCK     OB History   No obstetric history on file.     Family History  Problem Relation Age of Onset  . Cancer Mother        lung ca  . Depression Mother   . Hyperlipidemia Mother   . Hypertension Other     Social History   Tobacco Use  . Smoking status: Current Every Day Smoker    Packs/day: 1.00    Types: Cigarettes  . Smokeless tobacco: Never Used  Substance Use Topics  . Alcohol use: No  . Drug use: Yes    Types: Cocaine, Marijuana    Home Medications Prior to Admission medications   Medication Sig Start Date End Date Taking? Authorizing  Provider  busPIRone (BUSPAR) 10 MG tablet Take 1 tablet (10 mg total) by mouth 2 (two) times daily. 01/10/20   Sharma Covert, MD  carboxymethylcellulose (REFRESH PLUS) 0.5 % SOLN Place 1 drop into both eyes 3 (three) times daily as needed (Dry).    [provider]  ferrous sulfate 325 (65 FE) MG tablet Take 1 tablet by mouth daily.    [provider]  lamoTRIgine (LAMICTAL) 25 MG tablet Take 25-50 mg by mouth as directed. Take 1 tablet (25 mg) for 2 Weeks (14 Days) then Take 2 tablets (50 mg) Daily 01/05/20   [provider]  levocetirizine (XYZAL) 5 MG tablet Take 1 tablet by mouth daily as needed for  allergies.    [provider]  rosuvastatin (CRESTOR) 20 MG tablet Take 1 tablet by mouth daily. 10/19/19   [provider]    Allergies    Sulfa antibiotics and Elemental sulfur  Review of Systems   Review of Systems  Constitutional: Negative for chills, diaphoresis and fever.  HENT: Negative.  Negative for congestion.   Respiratory: Negative.  Negative for cough and shortness of breath.   Cardiovascular: Positive for chest pain. Negative for leg swelling.  Gastrointestinal: Negative.  Negative for abdominal pain and nausea.  Musculoskeletal: Negative.   Skin: Negative.   Neurological: Negative.  Negative for weakness and light-headedness.    Physical Exam Updated Vital Signs BP (!) 163/89 (BP Location: Right Arm)   Pulse 68   Temp 98 F (36.7 C) (Oral)   Resp 15   Ht 5\' 4"  (1.626 m)   Wt 81.6 kg   SpO2 100%   BMI 30.90 kg/m   Physical Exam Vitals and nursing note reviewed.  Constitutional:      Appearance: She is well-developed.  HENT:     Head: Normocephalic.  Neck:     Vascular: No carotid bruit.  Cardiovascular:     Rate and Rhythm: Normal rate and regular rhythm.     Heart sounds: No murmur heard.   Pulmonary:     Effort: Pulmonary effort is normal.     Breath sounds: Normal breath sounds. No wheezing, rhonchi or rales.  Chest:     Chest wall: Tenderness present.       Comments: Chest wall tender to palpation that reproduces the pain of complaint.  Abdominal:     General: Bowel sounds are normal.     Palpations: Abdomen is soft.     Tenderness: There is no abdominal tenderness. There is no guarding or rebound.  Musculoskeletal:        General: Normal range of motion.     Cervical back: Normal range of motion and neck supple.     Right lower leg: No edema.     Left lower leg: No edema.     Comments: No weakness of UE's. Pulses 2+  Skin:    General: Skin is warm and dry.  Neurological:     Mental Status: She is alert and oriented  to person, place, and time.     Sensory: No sensory deficit.     ED Results / Procedures / Treatments   Labs (all labs ordered are listed, but only abnormal results are displayed) Labs Reviewed  BASIC METABOLIC PANEL - Abnormal; Notable for the following components:      Result Value   CO2 21 (*)    Glucose, Bld 113 (*)    All other components within normal limits  CBC - Abnormal;  Notable for the following components:   WBC 10.6 (*)    RBC 5.27 (*)    Hemoglobin 15.9 (*)    HCT 48.1 (*)    All other components within normal limits  TROPONIN I (HIGH SENSITIVITY)  TROPONIN I (HIGH SENSITIVITY)   Results for orders placed or performed during the hospital encounter of 14/43/15  Basic metabolic panel  Result Value Ref Range   Sodium 138 135 - 145 mmol/L   Potassium 3.8 3.5 - 5.1 mmol/L   Chloride 107 98 - 111 mmol/L   CO2 21 (L) 22 - 32 mmol/L   Glucose, Bld 113 (H) 70 - 99 mg/dL   BUN 8 8 - 23 mg/dL   Creatinine, Ser 0.98 0.44 - 1.00 mg/dL   Calcium 9.7 8.9 - 10.3 mg/dL   GFR, Estimated >60 >60 mL/min   Anion gap 10 5 - 15  CBC  Result Value Ref Range   WBC 10.6 (H) 4.0 - 10.5 K/uL   RBC 5.27 (H) 3.87 - 5.11 MIL/uL   Hemoglobin 15.9 (H) 12.0 - 15.0 g/dL   HCT 48.1 (H) 36.0 - 46.0 %   MCV 91.3 80.0 - 100.0 fL   MCH 30.2 26.0 - 34.0 pg   MCHC 33.1 30.0 - 36.0 g/dL   RDW 12.2 11.5 - 15.5 %   Platelets 335 150 - 400 K/uL   nRBC 0.0 0.0 - 0.2 %  Troponin I (High Sensitivity)  Result Value Ref Range   Troponin I (High Sensitivity) 4 <18 ng/L  Troponin I (High Sensitivity)  Result Value Ref Range   Troponin I (High Sensitivity) 4 <18 ng/L     EKG EKG Interpretation  Date/Time:  Wednesday November 01 2020 22:15:12 EDT Ventricular Rate:  93 PR Interval:  152 QRS Duration: 76 QT Interval:  364 QTC Calculation: 452 R Axis:   90 Text Interpretation: Normal sinus rhythm Rightward axis Borderline ECG Confirmed by Thayer Jew [phone]) on 11/02/2020 5:08:04  AM   Radiology DG Chest 2 View  Result Date: 11/01/2020 CLINICAL DATA:  Left-sided chest pain. EXAM: CHEST - 2 VIEW COMPARISON:  10/01/2019 FINDINGS: The cardiomediastinal contours are normal. Borderline hyperinflation with mild chronic bronchial thickening. Pulmonary vasculature is normal. No consolidation, pleural effusion, or pneumothorax. No acute osseous abnormalities are seen. IMPRESSION: No acute chest findings. Electronically Signed   By: Keith Rake M.D.   On: 11/01/2020 22:55    Procedures Procedures   Medications Ordered in ED Medications - No data to display  ED Course  I have reviewed the triage vital signs and the nursing notes.  Pertinent labs & imaging results that were available during my care of the patient were reviewed by me and considered in my medical decision making (see chart for details).    MDM Rules/Calculators/A&P                          Patient to ED with complaint of chest pain as detailed in the HPI.   She reports current pain. No SOB at any time. VSS, mild hypertension (no history of same). Chest pain is reproducible to palpation. Troponin x 2 is negative. Labs unremarkable otherwise.  CXR clear. EKG, NSR.   Doubt ACS given work up and pain atypical for ischemia. With reproduced pain with palpation, feel this indicates chest wall pain. Will treat with ibuprofen, warm compresses and PCP follow up.   Her blood pressure is elevated in the ED on more  than one reading. Suggest discussion with PCP regarding same.   Final Clinical Impression(s) / ED Diagnoses Final diagnoses:  None   1. Chest wall pain  Rx / DC Orders ED Discharge Orders    None       Charlann Lange, PA-C 11/02/20 0522    Merryl Hacker, MD 11/02/20 [phone]

## 2021-09-17 DIAGNOSIS — M79641 Pain in right hand: Secondary | ICD-10-CM | POA: Insufficient documentation

## 2022-03-17 ENCOUNTER — Other Ambulatory Visit: Payer: Self-pay

## 2022-03-17 ENCOUNTER — Emergency Department (HOSPITAL_BASED_OUTPATIENT_CLINIC_OR_DEPARTMENT_OTHER)
Admission: EM | Admit: 2022-03-17 | Discharge: 2022-03-18 | Disposition: A | Discharge status: Home / Self Care | Payer: Self-pay | Attending: Emergency Medicine | Admitting: Emergency Medicine

## 2022-03-17 ENCOUNTER — Emergency Department (HOSPITAL_BASED_OUTPATIENT_CLINIC_OR_DEPARTMENT_OTHER): Payer: Self-pay

## 2022-03-17 DIAGNOSIS — R251 Tremor, unspecified: Secondary | ICD-10-CM | POA: Insufficient documentation

## 2022-03-17 DIAGNOSIS — R4182 Altered mental status, unspecified: Secondary | ICD-10-CM | POA: Insufficient documentation

## 2022-03-17 DIAGNOSIS — Y900 Blood alcohol level of less than 20 mg/100 ml: Secondary | ICD-10-CM | POA: Insufficient documentation

## 2022-03-17 DIAGNOSIS — R059 Cough, unspecified: Secondary | ICD-10-CM | POA: Insufficient documentation

## 2022-03-17 DIAGNOSIS — J439 Emphysema, unspecified: Secondary | ICD-10-CM | POA: Insufficient documentation

## 2022-03-17 DIAGNOSIS — R531 Weakness: Secondary | ICD-10-CM | POA: Insufficient documentation

## 2022-03-17 LAB — DIFFERENTIAL
Abs Immature Granulocytes: 0.02 10*3/uL (ref 0.00–0.07)
Basophils Absolute: 0.1 10*3/uL (ref 0.0–0.1)
Basophils Relative: 1 %
Eosinophils Absolute: 0.4 10*3/uL (ref 0.0–0.5)
Eosinophils Relative: 4 %
Immature Granulocytes: 0 %
Lymphocytes Relative: 47 %
Lymphs Abs: 4.4 10*3/uL — ABNORMAL HIGH (ref 0.7–4.0)
Monocytes Absolute: 0.5 10*3/uL (ref 0.1–1.0)
Monocytes Relative: 5 %
Neutro Abs: 4 10*3/uL (ref 1.7–7.7)
Neutrophils Relative %: 43 %

## 2022-03-17 LAB — I-STAT VENOUS BLOOD GAS, ED
Acid-Base Excess: 3 mmol/L — ABNORMAL HIGH (ref 0.0–2.0)
Bicarbonate: 29.5 mmol/L — ABNORMAL HIGH (ref 20.0–28.0)
Calcium, Ion: 1.24 mmol/L (ref 1.15–1.40)
HCT: 49 % — ABNORMAL HIGH (ref 36.0–46.0)
Hemoglobin: 16.7 g/dL — ABNORMAL HIGH (ref 12.0–15.0)
O2 Saturation: 56 %
Patient temperature: 100.2
Potassium: 5 mmol/L (ref 3.5–5.1)
Sodium: 137 mmol/L (ref 135–145)
TCO2: 31 mmol/L (ref 22–32)
pCO2, Ven: 50.3 mmHg (ref 44–60)
pH, Ven: 7.381 (ref 7.25–7.43)
pO2, Ven: 32 mmHg (ref 32–45)

## 2022-03-17 LAB — APTT: aPTT: 29 seconds (ref 24–36)

## 2022-03-17 LAB — CBC
HCT: 49.7 % — ABNORMAL HIGH (ref 36.0–46.0)
Hemoglobin: 16.9 g/dL — ABNORMAL HIGH (ref 12.0–15.0)
MCH: 30.3 pg (ref 26.0–34.0)
MCHC: 34 g/dL (ref 30.0–36.0)
MCV: 89.1 fL (ref 80.0–100.0)
Platelets: 301 10*3/uL (ref 150–400)
RBC: 5.58 MIL/uL — ABNORMAL HIGH (ref 3.87–5.11)
RDW: 12 % (ref 11.5–15.5)
WBC: 9.3 10*3/uL (ref 4.0–10.5)
nRBC: 0 % (ref 0.0–0.2)

## 2022-03-17 LAB — AMMONIA: Ammonia: 29 umol/L (ref 9–35)

## 2022-03-17 LAB — PROTIME-INR
INR: 0.9 (ref 0.8–1.2)
Prothrombin Time: 12 seconds (ref 11.4–15.2)

## 2022-03-17 LAB — ETHANOL: Alcohol, Ethyl (B): 10 mg/dL — ABNORMAL HIGH (ref <10)

## 2022-03-17 MED ORDER — SODIUM CHLORIDE 0.9 % IV BOLUS
500.0000 mL | Freq: Once | INTRAVENOUS | Status: AC
  Administered 2022-03-17: 500 mL via INTRAVENOUS

## 2022-03-17 MED ORDER — SODIUM CHLORIDE 0.9 % IV SOLN
100.0000 mL/h | INTRAVENOUS | Status: DC
  Administered 2022-03-17: 100 mL/h via INTRAVENOUS

## 2022-03-17 NOTE — ED Notes (Signed)
RT obtained VBG from pt PIV w/the following results and criticals. MD notified of results and any critical values. Pt respiratory status stable w/no distress noted at this time.    Latest Reference Range & Units 03/17/22 23:23  Sample type  VENOUS  pH, Ven 7.25 - 7.43  7.381  pCO2, Ven 44 - 60 mmHg 50.3  pO2, Ven 32 - 45 mmHg 32  TCO2 22 - 32 mmol/L 31  Acid-Base Excess 0.0 - 2.0 mmol/L 3.0 (H)  Bicarbonate 20.0 - 28.0 mmol/L 29.5 (H)  O2 Saturation % 56  Patient temperature  100.2 F  Collection site  IV start

## 2022-03-17 NOTE — ED Triage Notes (Signed)
POV, pt sts that she began having symptoms in jan/feb, escalated approx 3 hours ago, sts that she is having intermittent periods of confusion. When asked where she was she motioned that she needed to write  down her response 30 seconds later she was able to answer all questions appropriately and remained alert throughout and followed commands. sts "could be cancer or could be allergies" with vomiting, dry hacking cough and frequent napping. A&O x 4, BIB wheelchair with son.

## 2022-03-17 NOTE — ED Notes (Signed)
Pt refuses EKG - MD notified.

## 2022-03-17 NOTE — ED Provider Notes (Signed)
[facility]PT Provider Note   CSN: [id number] Arrival date & time: 03/17/22  2140     History  Chief Complaint  Patient presents with   Emesis   Altered Mental Status    Erica [facility] is a 61 y.o. female.   Emesis Altered Mental Status Associated symptoms: vomiting    Patient has a history of hyperlipidemia, emphysema, GERD, she is a chronic smoker.  Patient states she has a prior history of drug abuse but does not use any currently Patient states she has been having some episodes of intermittent weakness going on since January.  Patient also has had episodes of confusion.  She has been having episodes of tremors in her arm.  Patient states this evening she was sitting outside and felt weak.  She felt like she needed to go back inside.  Patient felt like she had an episode of unresponsiveness and could not speak or communicate.  She felt like she could follow commands and make gestures.  She has been having a cough.  She has been having to sleep a lot more than usual.  Home Medications Prior to Admission medications   Medication Sig Start Date End Date Taking? Authorizing Provider  busPIRone (BUSPAR) 10 MG tablet Take 1 tablet (10 mg total) by mouth 2 (two) times daily. 01/10/20   Sharma Covert, MD  carboxymethylcellulose (REFRESH PLUS) 0.5 % SOLN Place 1 drop into both eyes 3 (three) times daily as needed (Dry).    [provider]  ferrous sulfate 325 (65 FE) MG tablet Take 1 tablet by mouth daily.    [provider]  ibuprofen (ADVIL) 600 MG tablet Take 1 tablet (600 mg total) by mouth every 6 (six) hours as needed. 11/02/20   Charlann Lange, PA-C  lamoTRIgine (LAMICTAL) 25 MG tablet Take 25-50 mg by mouth as directed. Take 1 tablet (25 mg) for 2 Weeks (14 Days) then Take 2 tablets (50 mg) Daily 01/05/20   [provider]  levocetirizine (XYZAL) 5 MG tablet Take 1 tablet by mouth daily as needed for allergies.    [provider]  rosuvastatin (CRESTOR) 20 MG tablet Take 1 tablet by mouth daily. 10/19/19   [provider]      Allergies    Sulfa antibiotics and Elemental sulfur    Review of Systems   Review of Systems  Gastrointestinal:  Positive for vomiting.    Physical Exam Updated Vital Signs BP (!) 186/92   Pulse 79   Temp 100.2 F (37.9 C) (Oral)   Resp 16   Ht 1.626 m ('5\' 4"'$ )   Wt 81.6 kg   SpO2 100%   BMI 30.90 kg/m  Physical Exam Vitals and nursing note reviewed.  Constitutional:      General: She is not in acute distress.    Appearance: She is well-developed.  HENT:     Head: Normocephalic and atraumatic.     Right Ear: External ear normal.     Left Ear: External ear normal.  Eyes:     General: No scleral icterus.       Right eye: No discharge.        Left eye: No discharge.     Conjunctiva/sclera: Conjunctivae normal.  Neck:     Trachea: No tracheal deviation.  Cardiovascular:     Rate and Rhythm: Normal rate and regular rhythm.  Pulmonary:     Effort: Pulmonary effort is normal. No respiratory distress.  Breath sounds: Normal breath sounds. No stridor. No wheezing or rales.  Abdominal:     General: Bowel sounds are normal. There is no distension.     Palpations: Abdomen is soft.     Tenderness: There is no abdominal tenderness. There is no guarding or rebound.  Musculoskeletal:        General: No tenderness or deformity.     Cervical back: Neck supple.  Skin:    General: Skin is warm and dry.     Findings: No rash.  Neurological:     General: No focal deficit present.     Mental Status: She is alert and oriented to person, place, and time.     Cranial Nerves: No cranial nerve deficit (no facial droop, extraocular movements intact, no slurred speech).     Sensory: No sensory deficit.     Motor: No abnormal muscle tone or seizure activity.     Coordination: Coordination normal.     Comments: Normal speech, no facial droop, equal grip strength  bilaterally, normal plantarflexion bilaterally, able to lift legs off the bed without difficulty  Psychiatric:        Mood and Affect: Mood normal.     ED Results / Procedures / Treatments   Labs (all labs ordered are listed, but only abnormal results are displayed) Labs Reviewed  CBC - Abnormal; Notable for the following components:      Result Value   RBC 5.58 (*)    Hemoglobin 16.9 (*)    HCT 49.7 (*)    All other components within normal limits  DIFFERENTIAL - Abnormal; Notable for the following components:   Lymphs Abs 4.4 (*)    All other components within normal limits  I-STAT VENOUS BLOOD GAS, ED - Abnormal; Notable for the following components:   Bicarbonate 29.5 (*)    Acid-Base Excess 3.0 (*)    HCT 49.0 (*)    Hemoglobin 16.7 (*)    All other components within normal limits  ETHANOL  PROTIME-INR  APTT  COMPREHENSIVE METABOLIC PANEL  RAPID URINE DRUG SCREEN, HOSP PERFORMED  URINALYSIS, ROUTINE W REFLEX MICROSCOPIC  AMMONIA    EKG None  Radiology No results found.  Procedures Procedures    Medications Ordered in ED Medications  sodium chloride 0.9 % bolus 500 mL (500 mLs Intravenous New Bag/Given 03/17/22 2320)    Followed by  0.9 %  sodium chloride infusion (100 mL/hr Intravenous New Bag/Given 03/17/22 2320)    ED Course/ Medical Decision Making/ A&P                           Medical Decision Making Differential diagnosis includes but not limited to stroke, TIA, brain tumor, electrolyte abnormalities, hypercarbia  Amount and/or Complexity of Data Reviewed Labs: ordered. Radiology: ordered.  Risk Prescription drug management. Risk Details: Labs x-rays CT scans ordered.  Care turned over to Dr Karle Starch           Final Clinical Impression(s) / ED Diagnoses pending   Dorie Rank, MD 03/17/22 2337

## 2022-03-18 ENCOUNTER — Emergency Department (HOSPITAL_BASED_OUTPATIENT_CLINIC_OR_DEPARTMENT_OTHER): Payer: Self-pay

## 2022-03-18 LAB — COMPREHENSIVE METABOLIC PANEL
ALT: 25 U/L (ref 0–44)
AST: 25 U/L (ref 15–41)
Albumin: 4.7 g/dL (ref 3.5–5.0)
Alkaline Phosphatase: 90 U/L (ref 38–126)
Anion gap: 8 (ref 5–15)
BUN: 13 mg/dL (ref 8–23)
CO2: 26 mmol/L (ref 22–32)
Calcium: 9.8 mg/dL (ref 8.9–10.3)
Chloride: 103 mmol/L (ref 98–111)
Creatinine, Ser: 1.04 mg/dL — ABNORMAL HIGH (ref 0.44–1.00)
GFR, Estimated: >60 mL/min (ref >60)
Glucose, Bld: 112 mg/dL — ABNORMAL HIGH (ref 70–99)
Potassium: 3.5 mmol/L (ref 3.5–5.1)
Sodium: 137 mmol/L (ref 135–145)
Total Bilirubin: 0.4 mg/dL (ref 0.3–1.2)
Total Protein: 7.2 g/dL (ref 6.5–8.1)

## 2022-03-18 LAB — URINALYSIS, ROUTINE W REFLEX MICROSCOPIC
Bilirubin Urine: NEGATIVE
Glucose, UA: NEGATIVE mg/dL
Ketones, ur: NEGATIVE mg/dL
Leukocytes,Ua: NEGATIVE
Nitrite: NEGATIVE
Protein, ur: NEGATIVE mg/dL
Specific Gravity, Urine: 1.008 (ref 1.005–1.030)
pH: 5.5 (ref 5.0–8.0)

## 2022-03-18 LAB — RAPID URINE DRUG SCREEN, HOSP PERFORMED
Amphetamines: NOT DETECTED
Barbiturates: NOT DETECTED
Benzodiazepines: NOT DETECTED
Cocaine: NOT DETECTED
Opiates: NOT DETECTED
Tetrahydrocannabinol: NOT DETECTED

## 2022-03-18 NOTE — ED Notes (Signed)
Patient transported to CT 

## 2022-03-18 NOTE — ED Provider Notes (Signed)
Care of the patient assumed at the change of shift. Here for nonspecific AMS. Awaiting labs and imaging. Physical Exam  BP (!) 161/85   Pulse 71   Temp 99.4 F (37.4 C) (Oral)   Resp (!) 22   Ht '5\' 4"'$  (1.626 m)   Wt 81.6 kg   SpO2 97%   BMI 30.90 kg/m   Physical Exam  Procedures  Procedures  ED Course / MDM   Clinical Course as of 03/18/22 0305  Sun Mar 17, 2022  2348 CBC is unremarkable. Coags neg. VBG without acidosis or hypercapnea [CS]  Mon Mar 18, 2022  0003 Ammonia is normal. EtOH is not significantly elevated.  [CS]  0013 CMP is normal.  [CS]  0024 I personally viewed the images from radiology studies and agree with radiologist interpretation: CT is neg.   [CS]  3016 UA is clear.  [CS]  0109 UDS is clear [CS]  0303 Patient has remained asymptomatic in the ED. Tolerating PO without difficulty. I discussed her results with her including essentially normal workup in the ED. I recommend she follow up with her PCP for more extensive outpatient evaluation but she states 'I ain't following up with nobody'. She has no emergent medical condition or any indication for admission to the hospital.  [CS]    Clinical Course User Index [CS] Truddie Hidden, MD   Medical Decision Making Given presenting complaint, I considered that admission might be necessary. After review of results from ED lab and/or imaging studies, admission to the hospital is not indicated at this time.    Problems Addressed: Altered mental status, unspecified altered mental status type: acute illness or injury  Amount and/or Complexity of Data Reviewed Labs: ordered. Decision-making details documented in ED Course. Radiology: ordered and independent interpretation performed. Decision-making details documented in ED Course. ECG/medicine tests: ordered.    Details: Refused  Risk Prescription drug management. Decision regarding hospitalization.          Truddie Hidden, MD 03/18/22 [phone]

## 2022-03-18 NOTE — ED Notes (Signed)
Pt returned from CT °

## 2022-03-18 NOTE — ED Notes (Signed)
Pt verbalizes understanding of discharge instructions. Opportunity for questioning and answers were provided. Pt discharged from ED to home by self. Pt refused discharge vitals and verbally aggressive at time of discharge.

## 2022-07-22 ENCOUNTER — Telehealth (HOSPITAL_COMMUNITY): Payer: Self-pay | Admitting: Licensed Clinical Social Worker

## 2022-07-22 NOTE — Telephone Encounter (Signed)
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 The therapist attempts to reach her leaving a HIPAA-compliant voicemail with his direct callback number.  Adam Phenix, [place], LCSW, [facility], [place] 07/22/2022

## 2022-07-23 ENCOUNTER — Telehealth (HOSPITAL_COMMUNITY): Payer: Self-pay | Admitting: Licensed Clinical Social Worker

## 2022-07-23 NOTE — Telephone Encounter (Signed)
The therapist returns [facility] call confirming her identity via two identifiers. 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  [name] says that she does not have insurance currently until January 1st. As she has no insurance and lives in [place], the therapist gives her the contact number for the [employer] at [phone]. She will have [employer] as of January 1st through the [facility]. She is retired and living on an [place].   The therapist also makes her aware of the services through the [facility] such as the [employer] and Peer Support services providing the contact number.    Adam Phenix, [place], LCSW, [facility], [place] 07/23/2022

## 2022-08-19 DIAGNOSIS — Z419 Encounter for procedure for purposes other than remedying health state, unspecified: Secondary | ICD-10-CM | POA: Diagnosis not present

## 2022-08-28 DIAGNOSIS — F4323 Adjustment disorder with mixed anxiety and depressed mood: Secondary | ICD-10-CM | POA: Diagnosis not present

## 2022-08-28 DIAGNOSIS — F4310 Post-traumatic stress disorder, unspecified: Secondary | ICD-10-CM | POA: Diagnosis not present

## 2022-09-04 DIAGNOSIS — F4323 Adjustment disorder with mixed anxiety and depressed mood: Secondary | ICD-10-CM | POA: Diagnosis not present

## 2022-09-04 DIAGNOSIS — F4310 Post-traumatic stress disorder, unspecified: Secondary | ICD-10-CM | POA: Diagnosis not present

## 2022-09-11 DIAGNOSIS — F4323 Adjustment disorder with mixed anxiety and depressed mood: Secondary | ICD-10-CM | POA: Diagnosis not present

## 2022-09-18 DIAGNOSIS — F4323 Adjustment disorder with mixed anxiety and depressed mood: Secondary | ICD-10-CM | POA: Diagnosis not present

## 2022-09-19 DIAGNOSIS — Z419 Encounter for procedure for purposes other than remedying health state, unspecified: Secondary | ICD-10-CM | POA: Diagnosis not present

## 2022-09-25 DIAGNOSIS — F4323 Adjustment disorder with mixed anxiety and depressed mood: Secondary | ICD-10-CM | POA: Diagnosis not present

## 2022-09-27 ENCOUNTER — Encounter: Payer: Self-pay | Admitting: Family Medicine

## 2022-09-27 ENCOUNTER — Ambulatory Visit (INDEPENDENT_AMBULATORY_CARE_PROVIDER_SITE_OTHER): Payer: Medicaid Other | Admitting: Family Medicine

## 2022-09-27 VITALS — BP 132/78 | HR 69 | Temp 97.8°F | Ht 64.0 in | Wt 169.8 lb

## 2022-09-27 DIAGNOSIS — B3731 Acute candidiasis of vulva and vagina: Secondary | ICD-10-CM | POA: Insufficient documentation

## 2022-09-27 DIAGNOSIS — E785 Hyperlipidemia, unspecified: Secondary | ICD-10-CM

## 2022-09-27 DIAGNOSIS — Z1329 Encounter for screening for other suspected endocrine disorder: Secondary | ICD-10-CM | POA: Diagnosis not present

## 2022-09-27 DIAGNOSIS — B372 Candidiasis of skin and nail: Secondary | ICD-10-CM

## 2022-09-27 DIAGNOSIS — F331 Major depressive disorder, recurrent, moderate: Secondary | ICD-10-CM

## 2022-09-27 DIAGNOSIS — Z1211 Encounter for screening for malignant neoplasm of colon: Secondary | ICD-10-CM

## 2022-09-27 DIAGNOSIS — Z1231 Encounter for screening mammogram for malignant neoplasm of breast: Secondary | ICD-10-CM

## 2022-09-27 DIAGNOSIS — Z23 Encounter for immunization: Secondary | ICD-10-CM | POA: Diagnosis not present

## 2022-09-27 DIAGNOSIS — Z87891 Personal history of nicotine dependence: Secondary | ICD-10-CM

## 2022-09-27 LAB — LIPID PANEL
Cholesterol: 222 mg/dL — ABNORMAL HIGH (ref 0–200)
HDL: 44.3 mg/dL (ref >39.00)
LDL Cholesterol: 159 mg/dL — ABNORMAL HIGH (ref 0–99)
NonHDL: 177.65
Total CHOL/HDL Ratio: 5
Triglycerides: 94 mg/dL (ref 0.0–149.0)
VLDL: 18.8 mg/dL (ref 0.0–40.0)

## 2022-09-27 LAB — COMPREHENSIVE METABOLIC PANEL
ALT: 31 U/L (ref 0–35)
AST: 68 U/L — ABNORMAL HIGH (ref 0–37)
Albumin: 4.7 g/dL (ref 3.5–5.2)
Alkaline Phosphatase: 137 U/L — ABNORMAL HIGH (ref 39–117)
BUN: 17 mg/dL (ref 6–23)
CO2: 27 mEq/L (ref 19–32)
Calcium: 10.1 mg/dL (ref 8.4–10.5)
Chloride: 99 mEq/L (ref 96–112)
Creatinine, Ser: 1.23 mg/dL — ABNORMAL HIGH (ref 0.40–1.20)
GFR: 46.72 mL/min — ABNORMAL LOW (ref >60.00)
Glucose, Bld: 86 mg/dL (ref 70–99)
Potassium: 3.7 mEq/L (ref 3.5–5.1)
Sodium: 140 mEq/L (ref 135–145)
Total Bilirubin: 0.8 mg/dL (ref 0.2–1.2)
Total Protein: 7.6 g/dL (ref 6.0–8.3)

## 2022-09-27 LAB — HEMOGLOBIN A1C: Hgb A1c MFr Bld: 5.8 % (ref 4.6–6.5)

## 2022-09-27 LAB — TSH: TSH: 2.08 u[IU]/mL (ref 0.35–5.50)

## 2022-09-27 MED ORDER — ROSUVASTATIN CALCIUM 20 MG PO TABS: 20.0000 mg | ORAL_TABLET | Freq: Every day | ORAL | 0 refills | Status: DC

## 2022-09-27 MED ORDER — NYSTATIN 100000 UNIT/GM EX POWD: 1.0000 | Freq: Three times a day (TID) | CUTANEOUS | 0 refills | Status: AC

## 2022-09-27 MED ORDER — NYSTATIN 100000 UNIT/GM EX POWD: 1.0000 | Freq: Three times a day (TID) | CUTANEOUS | 0 refills | Status: DC

## 2022-09-27 NOTE — Patient Instructions (Addendum)
It was a pleasure meeting you today and I look forward to caring for you.  Ordered to have a mammogram completed, a referral to GI for colonoscopy, and ordered a CT lung screening. Proud of you for stopping smoking!  Refilled Crestor today, recommend to take medication at bedtime.  Ordered Nystatin Powder for yeast infection under neath breast and if it occurs in her vaginal area. Recommend to cleanse skin with Dial Antibacterial Soap, or Dove. Pat dry skin and apply Nystatin powder. When you are at home, you may place a wash cloth under neath breast to help soak up some of the mositure.  Ordered labs today and the office will notify you on Monday with results and any recommendations.  You had your flu vaccine today.  Take care and stay safe!

## 2022-09-27 NOTE — Assessment & Plan Note (Signed)
She is managing this through [facility] in [place]. In the process of obtaining records.

## 2022-09-27 NOTE — Progress Notes (Signed)
New Patient Office Visit  Subjective    Patient ID: Erica [facility], female    DOB: 07/06/1961  Age: 62 y.o. MRN: [id number]  CC:  Chief Complaint  Patient presents with   Establish Care    Pt is in need of a mammogram, and colonoscopy, lung cancer screening, did not have insurance last year and is trying to catch up.     Vaginitis    Pt notes yeast vaginally and under her breast since October and has been treating both with OTC meds and creams since then    Hyperlipidemia    Pt ran out of crestor a month ago    HPI  [facility] presents to establish care with new provider, update some of her health maintenance, and rash on underneath her breast and vaginal area.   Patients last colonoscopy was 2019 with [facility]. She has history of colon polyps. She reports she was suppose to have follow up colonoscopy every 3 years and was due last year.   Since in October 2023, she has had a vaginal and skin, underneath breast, yeast infection.  She reports the vaginal yeast infection is intermittent. She reports the skin yeast infection has been constant, but varies in severity. She has tried OTC Monistat and Desitin cream. She reports she has a history of yeast infections since 11th grade, but usually not prolonged.   She reports she is under the care [facility] in [place] for mental health.  Buspar, Lamicital, and Zoloft are being managed by this practice.   Outpatient Encounter Medications as of 09/27/2022  Medication Sig   busPIRone (BUSPAR) 10 MG tablet Take 1 tablet (10 mg total) by mouth 2 (two) times daily. (Patient taking differently: Take 10 mg by mouth 2 (two) times daily. Pt taking 4 times a day)   lamoTRIgine (LAMICTAL) 25 MG tablet Take 25-50 mg by mouth as directed. Take 1 tablet (25 mg) for 2 Weeks (14 Days) then Take 2 tablets (50 mg) Daily   Sertraline HCl (ZOLOFT PO) Take 100 mg by mouth daily.   carboxymethylcellulose (REFRESH PLUS) 0.5 % SOLN Place 1 drop into both  eyes 3 (three) times daily as needed (Dry). (Patient not taking: Reported on 09/27/2022)   ferrous sulfate 325 (65 FE) MG tablet Take 1 tablet by mouth daily.   ibuprofen (ADVIL) 600 MG tablet Take 1 tablet (600 mg total) by mouth every 6 (six) hours as needed. (Patient not taking: Reported on 09/27/2022)   levocetirizine (XYZAL) 5 MG tablet Take 1 tablet by mouth daily as needed for allergies. (Patient not taking: Reported on 09/27/2022)   rosuvastatin (CRESTOR) 20 MG tablet Take 1 tablet by mouth daily. (Patient not taking: Reported on 09/27/2022)   No facility-administered encounter medications on file as of 09/27/2022.    Past Medical History:  Diagnosis Date   Back complaints    Depression    GERD (gastroesophageal reflux disease)    denies meds   Hyperlipidemia    Shortness of breath dyspnea    "because I smoke"   Stroke ([place]) 2005   no residual per patient    Past Surgical History:  Procedure Laterality Date   ABDOMINAL HYSTERECTOMY     CARPAL TUNNEL RELEASE Left 08/26/2017   Procedure: Carpal Tunnel Release, Distal Nerve Transfer;  Surgeon: [name], MD;  Location: [facility];  Service: Orthopedics;  Laterality: Left;   CHOLECYSTECTOMY     EYE SURGERY     x 3  STRABISMUS SURGERY Bilateral 08/04/2015   Procedure: REPAIR STRABISMUS BILATERAL;  Surgeon: [name], MD;  Location: [facility];  Service: Ophthalmology;  Laterality: Bilateral;   TUBAL LIGATION  1997   ULNAR NERVE TRANSPOSITION Left 08/26/2017   Procedure: Left Ulnar Neuroplasty at the Elbow;  Surgeon: [name], MD;  Location: [facility];  Service: Orthopedics;  Laterality: Left;  GENERAL ANESTHESIA WITH PRE-OP BLOCK    Family History  Problem Relation Age of Onset   Cancer Mother        lung ca   Depression Mother    Hyperlipidemia Mother    Hypertension Other     Social History   Socioeconomic History   Marital status: Divorced    Spouse name: Not  on file   Number of children: Not on file   Years of education: Not on file   Highest education level: Not on file  Occupational History   Not on file  Tobacco Use   Smoking status: Former    Packs/day: 1.00    Types: Cigarettes    Quit date: 07/04/2022    Years since quitting: 0.2   Smokeless tobacco: Never  Substance and Sexual Activity   Alcohol use: No   Drug use: Yes    Types: Cocaine, Marijuana   Sexual activity: Not Currently  Other Topics Concern   Not on file  Social History Narrative   Not on file   Social Determinants of Health   Financial Resource Strain: Not on file  Food Insecurity: Not on file  Transportation Needs: Not on file  Physical Activity: Not on file  Stress: Not on file  Social Connections: Not on file  Intimate Partner Violence: Not on file    ROS See HPI above     Objective    BP 132/78   Pulse 69   Temp 97.8 F (36.6 C) (Temporal)   Ht 5' 4"$  (1.626 m)   Wt 169 lb 12.8 oz (77 kg)   SpO2 98%   BMI 29.15 kg/m   Physical Exam Vitals reviewed.  Constitutional:      General: She is not in acute distress.    Appearance: Normal appearance. She is not ill-appearing or toxic-appearing.  Eyes:     General:        Right eye: No discharge.        Left eye: No discharge.     Conjunctiva/sclera: Conjunctivae normal.  Cardiovascular:     Rate and Rhythm: Normal rate and regular rhythm.     Heart sounds: Normal heart sounds. No murmur heard.    No friction rub. No gallop.  Pulmonary:     Effort: Pulmonary effort is normal. No respiratory distress.     Breath sounds: Normal breath sounds.  Musculoskeletal:        General: Normal range of motion.  Skin:    General: Skin is warm and dry.     Findings: Rash present.     Comments: Underneath right breast-moist, scaly slight pink skin. No open areas noted. Underneath right breast-moist skin with no open areas. Groin folds-mild scaly skin at left fold and clear on right fold.    Neurological:     General: No focal deficit present.     Mental Status: She is alert. Mental status is at baseline.  Psychiatric:        Mood and Affect: Mood normal.        Behavior: Behavior normal.  Thought Content: Thought content normal.        Judgment: Judgment normal.         Assessment & Plan:  Health maintenance: Ordered mammogram; sent a referral to GI for colonoscopy; ordered CT lung screening; ordered influenza vaccine.  Congratulated patient on stopping smoking back in November 2023.  Encounter for screening mammogram for malignant neoplasm of breast -     Digital Screening Mammogram, Left and Right; Future -     Ambulatory referral to Gastroenterology  Colon cancer screening    No follow-ups on file.   Valarie Merino, NP

## 2022-09-27 NOTE — Assessment & Plan Note (Addendum)
Prescribed Nystatin powder to apply underneath the breast and groin folds. Recommend to cleanse skin with Dial Antibacterial soap or Dove, pat skin dry, and apply medication underneath the breast. When she is at home, recommend patient to place a wash cloth underneath breast to help soak up some of the moisture. Currently, does not have yeast present on groin folds, but if she does, follow the same instructions for her breast. Also, will check A1c to assess for diabetes with the prolonged reported rash.

## 2022-09-27 NOTE — Assessment & Plan Note (Signed)
Refilled Crestor since patient has not been taking medication. She reports she ran out of medication. Ordered lipid levels. Patient is fasting. Previous history of stroke, but under the care of neurology or cardiology.

## 2022-09-30 ENCOUNTER — Telehealth: Payer: Self-pay | Admitting: Family Medicine

## 2022-09-30 NOTE — Telephone Encounter (Signed)
Pt is calling in to the office stating that she has rec'd a letter stating that she has a referral for a GI and it has that she is to get a mammogram done at a GI office I assure her that it was a mistake and we will get it corrected for her.

## 2022-09-30 NOTE — Telephone Encounter (Signed)
Cleared up the confusion pt was sent to GI Breast center, not a gastro. Provided phone number and pt will call to schedule

## 2022-10-01 ENCOUNTER — Telehealth: Payer: Self-pay | Admitting: Family Medicine

## 2022-10-01 NOTE — Telephone Encounter (Signed)
Called patient and she wants a sooner appt

## 2022-10-01 NOTE — Addendum Note (Signed)
Addended by: Patrcia Dolly on: 10/01/2022 10:25 AM   Modules accepted: Orders

## 2022-10-01 NOTE — Telephone Encounter (Signed)
Caller name: MAKAYDEN REDBIRD  On DPR?: Yes  Call back number: [phone] (mobile)  Provider they see: Evangeline Gula, NP  Reason for call: Pt wants about pre-diabetes diagnosis

## 2022-10-02 DIAGNOSIS — F4323 Adjustment disorder with mixed anxiety and depressed mood: Secondary | ICD-10-CM | POA: Diagnosis not present

## 2022-10-03 ENCOUNTER — Other Ambulatory Visit (INDEPENDENT_AMBULATORY_CARE_PROVIDER_SITE_OTHER): Payer: Medicaid Other

## 2022-10-03 DIAGNOSIS — R748 Abnormal levels of other serum enzymes: Secondary | ICD-10-CM | POA: Diagnosis not present

## 2022-10-03 LAB — GAMMA GT: GGT: 15 U/L (ref 7–51)

## 2022-10-04 ENCOUNTER — Telehealth: Payer: Self-pay

## 2022-10-04 NOTE — Telephone Encounter (Signed)
-----   Message from Evangeline Gula, NP sent at 10/03/2022  7:39 PM EST ----- GGT is normal, which means it is not related to liver.

## 2022-10-04 NOTE — Telephone Encounter (Signed)
Pt seen results via my chart  

## 2022-10-09 ENCOUNTER — Encounter: Payer: Self-pay | Admitting: Family Medicine

## 2022-10-09 DIAGNOSIS — F4323 Adjustment disorder with mixed anxiety and depressed mood: Secondary | ICD-10-CM | POA: Diagnosis not present

## 2022-10-09 DIAGNOSIS — N289 Disorder of kidney and ureter, unspecified: Secondary | ICD-10-CM

## 2022-10-09 NOTE — Telephone Encounter (Signed)
Spoke with pt to let her know Nephrologist will contact her about appt.

## 2022-10-16 DIAGNOSIS — F4323 Adjustment disorder with mixed anxiety and depressed mood: Secondary | ICD-10-CM | POA: Diagnosis not present

## 2022-10-17 DIAGNOSIS — F3181 Bipolar II disorder: Secondary | ICD-10-CM | POA: Diagnosis not present

## 2022-10-18 DIAGNOSIS — Z419 Encounter for procedure for purposes other than remedying health state, unspecified: Secondary | ICD-10-CM | POA: Diagnosis not present

## 2022-10-23 DIAGNOSIS — F4323 Adjustment disorder with mixed anxiety and depressed mood: Secondary | ICD-10-CM | POA: Diagnosis not present

## 2022-11-08 ENCOUNTER — Other Ambulatory Visit: Payer: Self-pay

## 2022-11-13 DIAGNOSIS — F4323 Adjustment disorder with mixed anxiety and depressed mood: Secondary | ICD-10-CM | POA: Diagnosis not present

## 2022-11-14 ENCOUNTER — Ambulatory Visit: Payer: Medicaid Other

## 2022-11-18 DIAGNOSIS — Z419 Encounter for procedure for purposes other than remedying health state, unspecified: Secondary | ICD-10-CM | POA: Diagnosis not present

## 2022-11-20 DIAGNOSIS — F3181 Bipolar II disorder: Secondary | ICD-10-CM | POA: Diagnosis not present

## 2022-11-20 DIAGNOSIS — F4323 Adjustment disorder with mixed anxiety and depressed mood: Secondary | ICD-10-CM | POA: Diagnosis not present

## 2022-11-29 ENCOUNTER — Other Ambulatory Visit: Payer: Self-pay | Admitting: Family Medicine

## 2022-11-29 DIAGNOSIS — E785 Hyperlipidemia, unspecified: Secondary | ICD-10-CM

## 2022-12-04 DIAGNOSIS — F4323 Adjustment disorder with mixed anxiety and depressed mood: Secondary | ICD-10-CM | POA: Diagnosis not present

## 2022-12-18 DIAGNOSIS — Z419 Encounter for procedure for purposes other than remedying health state, unspecified: Secondary | ICD-10-CM | POA: Diagnosis not present

## 2022-12-26 NOTE — Progress Notes (Deleted)
Complete physical exam  Patient: [facility]a [facility]   DOB: Sep 27, 1960   62 y.o. Female  MRN: [id number]  Subjective:    No chief complaint on file.   Erica Bradley is a 62 y.o. female who presents today for a complete physical exam. She reports consuming a {diet types:17450} diet. {types:19826} She generally feels {DESC; WELL/FAIRLY WELL/POORLY:18703}. She reports sleeping {DESC; WELL/FAIRLY WELL/POORLY:18703}. She {does/does not:200015} have additional problems to discuss today.    Most recent fall risk assessment:    03/24/2019    9:36 AM  Fall Risk   Falls in the past year? 0  Number falls in past yr: 0  Injury with Fall? 0     Most recent depression screenings:    03/24/2019    9:36 AM 02/14/2018   12:00 PM  PHQ 2/9 Scores  PHQ - 2 Score 0 0    {VISON DENTAL STD PSA (Optional):27386}  {History (Optional):23778}  Patient Care Team: Alveria Apley, NP as PCP - General (Family Medicine) Nahser, Deloris Ping, MD (Cardiology)   Outpatient Medications Prior to Visit  Medication Sig   albuterol (VENTOLIN HFA) 108 (90 Base) MCG/ACT inhaler Inhale into the lungs.   busPIRone (BUSPAR) 10 MG tablet Take by mouth.   busPIRone (BUSPAR) 5 MG tablet    Carboxymethylcellulose Sod PF (REFRESH PLUS) 0.5 % SOLN Apply to eye.   FLUoxetine (PROZAC) 10 MG capsule Take by mouth.   HYDROcodone-acetaminophen (NORCO/VICODIN) 5-325 MG tablet Take 1 tablet twice a day by oral route.   lamoTRIgine (LAMICTAL) 25 MG tablet Take 25-50 mg by mouth as directed. Take 1 tablet (25 mg) for 2 Weeks (14 Days) then Take 2 tablets (50 mg) Daily   nystatin (MYCOSTATIN/NYSTOP) powder Apply 1 Application topically 3 (three) times daily.   rosuvastatin (CRESTOR) 20 MG tablet TAKE 1 TABLET (20 MG TOTAL) BY MOUTH DAILY.   Sertraline HCl (ZOLOFT PO) Take 100 mg by mouth daily.   No facility-administered medications prior to visit.    ROS See HPI above       Objective:     There were no vitals taken for  this visit. {Vitals History (Optional):23777}  Physical Exam   No results found for any visits on 12/27/22. {Show previous labs (optional):23779}    Assessment & Plan:    Routine Health Maintenance and Physical Exam  Immunization History  Administered Date(s) Administered   Fluad Quad(high Dose 65+) 09/27/2022   Influenza Inj Mdck Quad Pf 06/24/2018   Influenza Inj Mdck Quad With Preservative 06/24/2018   Influenza, Quadrivalent, Recombinant, Inj, Pf 06/24/2018, 06/24/2018   Influenza,inj,Quad PF,6+ Mos 06/26/2017, 04/08/2019   Influenza,inj,Quad PF,6-35 Mos 04/08/2019   Influenza-Unspecified 06/26/2017   PFIZER(Purple Top)SARS-COV-2 Vaccination 12/27/2019, 01/13/2020   Pneumococcal Polysaccharide-23 04/07/2019, 04/07/2019   Tdap 10/23/2017   Zoster Recombinat (Shingrix) 04/08/2019    Health Maintenance  Topic Date Due   PAP SMEAR-Modifier  Never done   Zoster Vaccines- Shingrix (2 of 2) 06/03/2019   COVID-19 Vaccine (3 - Pfizer risk series) 02/10/2020   MAMMOGRAM  02/21/2020   Lung Cancer Screening  02/27/2022   INFLUENZA VACCINE  03/20/2023   DTaP/Tdap/Td (2 - Td or Tdap) 10/24/2027   COLONOSCOPY (Pts 45-50yrs Insurance coverage will need to be confirmed)  10/24/2027   Hepatitis C Screening  Completed   HIV Screening  Completed   HPV VACCINES  Aged Out    Discussed health benefits of physical activity, and encouraged her to engage in regular exercise appropriate for her  age and condition.  Annual physical exam  1.Review health maintenance: -On previous visit, mammogram was ordered, scheduled, but patient canceled the appointment. Call [facility] to reschedule mammogram.  -On previous visit, CT lung scan was ordered. Will provide the contact information for [facility] to schedule an appointment.  -On previous visit, placed a referral to GI, placing another for a follow up colonoscopy.  2.Placed a referral to nephrology-[facility]. She was called twice  to schedule and has not rescheduled. Recommend patient call to schedule an appointment.  No follow-ups on file. Nephrology referral?     Zandra Abts, NP

## 2022-12-27 ENCOUNTER — Encounter: Payer: Medicaid Other | Admitting: Family Medicine

## 2022-12-27 DIAGNOSIS — Z0000 Encounter for general adult medical examination without abnormal findings: Secondary | ICD-10-CM

## 2022-12-27 DIAGNOSIS — E785 Hyperlipidemia, unspecified: Secondary | ICD-10-CM

## 2022-12-27 DIAGNOSIS — Z1211 Encounter for screening for malignant neoplasm of colon: Secondary | ICD-10-CM

## 2022-12-27 DIAGNOSIS — R7303 Prediabetes: Secondary | ICD-10-CM

## 2022-12-27 DIAGNOSIS — N1831 Chronic kidney disease, stage 3a: Secondary | ICD-10-CM

## 2023-01-18 DIAGNOSIS — Z419 Encounter for procedure for purposes other than remedying health state, unspecified: Secondary | ICD-10-CM | POA: Diagnosis not present

## 2023-02-17 DIAGNOSIS — Z419 Encounter for procedure for purposes other than remedying health state, unspecified: Secondary | ICD-10-CM | POA: Diagnosis not present

## 2023-02-18 ENCOUNTER — Ambulatory Visit: Payer: Medicaid Other | Admitting: Family Medicine

## 2023-03-13 ENCOUNTER — Encounter (HOSPITAL_BASED_OUTPATIENT_CLINIC_OR_DEPARTMENT_OTHER): Payer: Self-pay | Admitting: Emergency Medicine

## 2023-03-13 ENCOUNTER — Emergency Department (HOSPITAL_BASED_OUTPATIENT_CLINIC_OR_DEPARTMENT_OTHER)
Admission: EM | Admit: 2023-03-13 | Discharge: 2023-03-13 | Discharge status: Against Medical Advice | Payer: Medicaid Other | Attending: Emergency Medicine | Admitting: Emergency Medicine

## 2023-03-13 ENCOUNTER — Other Ambulatory Visit: Payer: Self-pay

## 2023-03-13 DIAGNOSIS — Z8673 Personal history of transient ischemic attack (TIA), and cerebral infarction without residual deficits: Secondary | ICD-10-CM | POA: Diagnosis not present

## 2023-03-13 DIAGNOSIS — R531 Weakness: Secondary | ICD-10-CM | POA: Diagnosis present

## 2023-03-13 DIAGNOSIS — Z5329 Procedure and treatment not carried out because of patient's decision for other reasons: Secondary | ICD-10-CM | POA: Diagnosis not present

## 2023-03-13 DIAGNOSIS — R200 Anesthesia of skin: Secondary | ICD-10-CM | POA: Insufficient documentation

## 2023-03-13 DIAGNOSIS — N183 Chronic kidney disease, stage 3 unspecified: Secondary | ICD-10-CM | POA: Insufficient documentation

## 2023-03-13 DIAGNOSIS — R202 Paresthesia of skin: Secondary | ICD-10-CM | POA: Diagnosis not present

## 2023-03-13 LAB — CBC WITH DIFFERENTIAL/PLATELET
Abs Immature Granulocytes: 0.01 10*3/uL (ref 0.00–0.07)
Basophils Absolute: 0.1 10*3/uL (ref 0.0–0.1)
Basophils Relative: 1 %
Eosinophils Absolute: 0.2 10*3/uL (ref 0.0–0.5)
Eosinophils Relative: 3 %
HCT: 47 % — ABNORMAL HIGH (ref 36.0–46.0)
Hemoglobin: 15.9 g/dL — ABNORMAL HIGH (ref 12.0–15.0)
Immature Granulocytes: 0 %
Lymphocytes Relative: 47 %
Lymphs Abs: 3.6 10*3/uL (ref 0.7–4.0)
MCH: 30.2 pg (ref 26.0–34.0)
MCHC: 33.8 g/dL (ref 30.0–36.0)
MCV: 89.4 fL (ref 80.0–100.0)
Monocytes Absolute: 0.4 10*3/uL (ref 0.1–1.0)
Monocytes Relative: 5 %
Neutro Abs: 3.4 10*3/uL (ref 1.7–7.7)
Neutrophils Relative %: 44 %
Platelets: 336 10*3/uL (ref 150–400)
RBC: 5.26 MIL/uL — ABNORMAL HIGH (ref 3.87–5.11)
RDW: 12.4 % (ref 11.5–15.5)
WBC: 7.7 10*3/uL (ref 4.0–10.5)
nRBC: 0 % (ref 0.0–0.2)

## 2023-03-13 LAB — URINALYSIS, ROUTINE W REFLEX MICROSCOPIC
Bacteria, UA: NONE SEEN
Bilirubin Urine: NEGATIVE
Glucose, UA: NEGATIVE mg/dL
Ketones, ur: NEGATIVE mg/dL
Leukocytes,Ua: NEGATIVE
Nitrite: NEGATIVE
Protein, ur: NEGATIVE mg/dL
Specific Gravity, Urine: 1.018 (ref 1.005–1.030)
pH: 5.5 (ref 5.0–8.0)

## 2023-03-13 LAB — COMPREHENSIVE METABOLIC PANEL
ALT: 16 U/L (ref 0–44)
AST: 16 U/L (ref 15–41)
Albumin: 4.5 g/dL (ref 3.5–5.0)
Alkaline Phosphatase: 90 U/L (ref 38–126)
Anion gap: 9 (ref 5–15)
BUN: 17 mg/dL (ref 8–23)
CO2: 26 mmol/L (ref 22–32)
Calcium: 9.9 mg/dL (ref 8.9–10.3)
Chloride: 103 mmol/L (ref 98–111)
Creatinine, Ser: 1.17 mg/dL — ABNORMAL HIGH (ref 0.44–1.00)
GFR, Estimated: 52 mL/min — ABNORMAL LOW (ref >60)
Glucose, Bld: 105 mg/dL — ABNORMAL HIGH (ref 70–99)
Potassium: 3.4 mmol/L — ABNORMAL LOW (ref 3.5–5.1)
Sodium: 138 mmol/L (ref 135–145)
Total Bilirubin: 0.6 mg/dL (ref 0.3–1.2)
Total Protein: 7.2 g/dL (ref 6.5–8.1)

## 2023-03-13 LAB — LIPASE, BLOOD: Lipase: 26 U/L (ref 11–51)

## 2023-03-13 NOTE — ED Provider Notes (Addendum)
[name] Provider Note   CSN: [id number] Arrival date & time: 03/13/23  1716     History  Chief Complaint  Patient presents with   Weakness    Erica Bradley is a 62 y.o. female with medical history of stroke in 2005, dyspnea on exertion, GERD, emphysema of the lungs, chronic knee pain, chronic back pain, CKD stage III, TIA.  Patient presents to ED for evaluation of abdominal pain secondary to umbilical hernia, weakness secondary to decreased appetite, left arm numbness that occurred 3 days ago after eating a CBD edible. Patient reports that 3 days ago she ate a hemp gummy and had subjective left arm numbness as a result. She reports that this episode lasted for 10 minutes before resolving. During this episode she denied any one sided weakness, slurred speech, facial droop, aphasia. She states that it resolved she is here concerned that she might possibly have a stroke in the future. She states she knows that she needs an MRI because she has worked in the [employer]. She is also concerned about her generalized weakness which she states is occurring because she has had a decreased appetite secondary to nausea for the last 3 days.  She states that her nausea is secondary to her umbilical hernia that she knows about and has not seen general surgery for.  She is also concerned that she has colon cancer. She has not seen her PCP regarding this concern. She reports that she had a TIA back in January and she was hospitalized for this. She states that she was advised to see neurology upon discharge but has not done so because she "did not want to".  She denies chest pain, shortness of breath, fevers, vomiting, flank pain, dysuria. She does endorse urinary frequency. She denies headache, neck pain, one-sided weakness, slurred speech, facial droop, word finding difficulties.    Weakness Associated symptoms: abdominal pain and nausea   Associated symptoms: no  chest pain, no diarrhea, no dizziness, no dysuria, no shortness of breath and no vomiting        Home Medications Prior to Admission medications   Medication Sig Start Date End Date Taking? Authorizing Provider  albuterol (VENTOLIN HFA) 108 (90 Base) MCG/ACT inhaler Inhale into the lungs. 02/08/21   [provider]  busPIRone (BUSPAR) 10 MG tablet Take by mouth. 05/12/17   [provider]  busPIRone (BUSPAR) 5 MG tablet     [provider]  Carboxymethylcellulose Sod PF (REFRESH PLUS) 0.5 % SOLN Apply to eye. 01/11/20   [provider]  FLUoxetine (PROZAC) 10 MG capsule Take by mouth.    [provider]  HYDROcodone-acetaminophen (NORCO/VICODIN) 5-325 MG tablet Take 1 tablet twice a day by oral route. 09/17/21   [provider]  lamoTRIgine (LAMICTAL) 25 MG tablet Take 25-50 mg by mouth as directed. Take 1 tablet (25 mg) for 2 Weeks (14 Days) then Take 2 tablets (50 mg) Daily 01/05/20   [provider]  nystatin (MYCOSTATIN/NYSTOP) powder Apply 1 Application topically 3 (three) times daily. 09/27/22   Alveria Apley, NP  rosuvastatin (CRESTOR) 20 MG tablet TAKE 1 TABLET (20 MG TOTAL) BY MOUTH DAILY. 11/29/22 02/27/23  Alveria Apley, NP  Sertraline HCl (ZOLOFT PO) Take 100 mg by mouth daily.    [provider]      Allergies    Sulfa antibiotics, Elemental sulfur, and Sulfur dioxide    Review of Systems   Review of  Systems  Constitutional:  Positive for appetite change.  Respiratory:  Negative for shortness of breath.   Cardiovascular:  Negative for chest pain.  Gastrointestinal:  Positive for abdominal pain and nausea. Negative for diarrhea and vomiting.  Genitourinary:  Negative for dysuria.  Musculoskeletal:  Negative for neck pain and neck stiffness.  Neurological:  Positive for weakness and numbness. Negative for dizziness, syncope, facial asymmetry and speech difficulty.  All other systems reviewed and  are negative.   Physical Exam Updated Vital Signs BP (!) 159/102 (BP Location: Right Arm)   Pulse 93   Temp 98.9 F (37.2 C)   Resp 18   SpO2 97%  Physical Exam Vitals and nursing note reviewed.  Constitutional:      General: She is not in acute distress.    Appearance: Normal appearance. She is not ill-appearing, toxic-appearing or diaphoretic.  HENT:     Head: Normocephalic and atraumatic.     Nose: Nose normal.     Mouth/Throat:     Mouth: Mucous membranes are moist.     Pharynx: Oropharynx is clear.  Eyes:     Extraocular Movements: Extraocular movements intact.     Conjunctiva/sclera: Conjunctivae normal.     Pupils: Pupils are equal, round, and reactive to light.  Cardiovascular:     Rate and Rhythm: Normal rate and regular rhythm.  Pulmonary:     Effort: Pulmonary effort is normal.     Breath sounds: Normal breath sounds. No wheezing.  Abdominal:     General: Abdomen is flat. Bowel sounds are normal.     Palpations: Abdomen is soft.     Tenderness: There is no abdominal tenderness.  Musculoskeletal:     Cervical back: Normal range of motion and neck supple. No tenderness.  Skin:    General: Skin is warm and dry.     Capillary Refill: Capillary refill takes less than 2 seconds.  Neurological:     General: No focal deficit present.     Mental Status: She is alert and oriented to person, place, and time.     GCS: GCS eye subscore is 4. GCS verbal subscore is 5. GCS motor subscore is 6.     Cranial Nerves: Cranial nerves 2-12 are intact. No cranial nerve deficit.     Sensory: Sensation is intact. No sensory deficit.     Motor: Motor function is intact. No weakness.     Coordination: Coordination is intact. Heel to [facility] Test normal.     Comments: In tact finger to nose and heel to shin. 5/5 strength to bilateral lower extremities and upper extremities. Patient has strength against resistance to bilateral lower extremities. 54/5 strength to upper extremities  bilaterally, patient reports this is baseline for her. CN II through XII intact.      ED Results / Procedures / Treatments   Labs (all labs ordered are listed, but only abnormal results are displayed) Labs Reviewed  CBC WITH DIFFERENTIAL/PLATELET - Abnormal; Notable for the following components:      Result Value   RBC 5.26 (*)    Hemoglobin 15.9 (*)    HCT 47.0 (*)    All other components within normal limits  COMPREHENSIVE METABOLIC PANEL - Abnormal; Notable for the following components:   Potassium 3.4 (*)    Glucose, Bld 105 (*)    Creatinine, Ser 1.17 (*)    GFR, Estimated 52 (*)    All other components within normal limits  URINALYSIS, ROUTINE W REFLEX MICROSCOPIC - Abnormal;  Notable for the following components:   Hgb urine dipstick SMALL (*)    All other components within normal limits  LIPASE, BLOOD    EKG EKG Interpretation Date/Time:  Thursday March 13 2023 17:38:02 EDT Ventricular Rate:  87 PR Interval:  158 QRS Duration:  92 QT Interval:  385 QTC Calculation: 464 R Axis:   93  Text Interpretation: Sinus rhythm Right axis deviation No significant change since last tracing Confirmed by Elayne Snare ([id number]) on 03/13/2023 5:49:24 PM  Radiology No results found.  Procedures Procedures   Medications Ordered in ED Medications - No data to display  ED Course/ Medical Decision Making/ A&P  Medical Decision Making Amount and/or Complexity of Data Reviewed Labs: ordered. Radiology: ordered.   62 year old female presents to ED for evaluation.  Please see HPI for further details.  On examination the patient is afebrile and nontachycardic.  Her lung sounds are clear bilaterally and she is not hypoxic.  Her abdomen is soft and compressible throughout.  She has no tenderness to abdomen.  Her neurological examination shows no focal neurodeficits.  She has intact finger-nose, heel-to-shin.  She has 5 out of 5 strength bilateral lower extremities and bilateral  upper extremities.  She has no facial droop, no slurred speech, no pronator drift.  CN II through XII intact.  She ambulates with a steady gait and is shown to do so multiple times throughout her stay in the department.  Patient presenting story is concerning for TIA.  She does report subjective numbness of her left upper arm after ingesting hemp gummy.  She states that she has history of TIA.  Due to concerning story and history of TIA, I do feel the patient needs MRI of her brain.  I have placed MRI order at this time.  I have also placed order to include CBC, CMP, lipase, urinalysis as the patient reports abdominal pain however none noted on physical exam.  Update: Nursing staff notified me that the patient has been marked as a violence risk as she has been making threatening remarks to staff on the floor.  Update: CBC shows no leukocytosis or anemia.  Metabolic panel shows potassium 3.4, creatinine 1.17 which is baseline.  LFTs not elevated, anion gap 9.  Urinalysis unremarkable.  Nursing staff notified me that the MRI techs here at [facility] have left.  I got in touch with provider at [facility], Dr. Suezanne Jacquet, who has agreed to accept the patient as transfer for ED to ED transfer for MRI of brain to rule out TIA.  When I went to the patient room to explain this to her, the patient stated that she wished to be discharged at this time.  She states that she "has too much medical knowledge and y'all are making me wait too long".  She states that she often gets upset at ERs because "I have so much medical knowledge and I know what needs to happen but no one is doing it for me". I again explained that due to scheduling limitations the staff at [facility] had left, however I had arranged for a transfer to [facility] where I had an accepting physician present and waiting. Her friend stated she was willing to drive her to [facility] for MR of brain. Patient again deferred on this stating that she wished to be discharged.  The patient was then offered a CT scan of her head however she again deferred and requested discharge. She is alert and oriented x 4.  She ambulates with  a steady gait and has no ataxia.  She is aware of the risks of leaving AGAINST MEDICAL ADVICE without having workup completed and she states that she except these risks.  I explained to the patient that it is of utmost importance that she has MRI of her brain to rule out TIA and she agrees that she needs MRI of her brain but she is not willing to stay at this time for the study.  She is not willing to be transferred to [facility] for further management.  Patient signed out AGAINST MEDICAL ADVICE at this time.  She was given referral to neurology.  She states that she will return to the ED with any new or worsening signs or symptoms.  Patient left AGAINST MEDICAL ADVICE.   Final Clinical Impression(s) / ED Diagnoses Final diagnoses:  Left arm numbness    Rx / DC Orders ED Discharge Orders     None         Al Decant, PA-C 03/13/23 1949    Rexford Maus, DO 03/13/23 2333    Al Decant, PA-C 03/14/23 0100    Elayne Snare K, DO 03/14/23 1502

## 2023-03-13 NOTE — ED Notes (Signed)
ED Provider at bedside. 

## 2023-03-13 NOTE — Discharge Instructions (Signed)
You are leaving AGAINST MEDICAL ADVICE.  As we discussed, I would like to send you to [facility] for MRI of your brain as I believe that this is the appropriate imaging study you need.  You have requested discharge at this time and have stated that you do not wish to wait or be sent to [facility].  You are of sound mind and body and able to make medical decisions.  If you change your mind, please return to the ED for further management and workup.  I have given you a referral to neurology at your request.  This does not replace or substitute the need for MRI of your brain.  You are leaving AGAINST MEDICAL ADVICE.

## 2023-03-13 NOTE — ED Notes (Signed)
Pt advised of risks regarding leaving AMA with pt. Pt verbalized understanding and still wishes to leave. Pt refused vitals prior to discharge. Pt ambulatory with cane upon departure

## 2023-03-13 NOTE — ED Triage Notes (Addendum)
Pt arrives with friend who has a sheet of paper with multiple complaints that include: weakness, headache for 3 days, frequent urination, nausea, diarrhea, lack of coordination, hand tremors, belly button pain due to hernia. Pt reports TIA in January this year. Pt reports using hemp gummies yesterday and then began to have left arm pain. Denies CP. 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

## 2023-03-20 DIAGNOSIS — Z419 Encounter for procedure for purposes other than remedying health state, unspecified: Secondary | ICD-10-CM | POA: Diagnosis not present

## 2023-03-26 ENCOUNTER — Encounter: Payer: Self-pay | Admitting: Neurology

## 2023-03-26 ENCOUNTER — Ambulatory Visit: Payer: Medicaid Other | Admitting: Neurology

## 2023-04-10 ENCOUNTER — Other Ambulatory Visit: Payer: Self-pay

## 2023-04-10 ENCOUNTER — Other Ambulatory Visit: Payer: Self-pay | Admitting: Family Medicine

## 2023-04-10 DIAGNOSIS — E785 Hyperlipidemia, unspecified: Secondary | ICD-10-CM

## 2023-04-10 DIAGNOSIS — F3181 Bipolar II disorder: Secondary | ICD-10-CM | POA: Diagnosis not present

## 2023-04-10 MED ORDER — ROSUVASTATIN CALCIUM 20 MG PO TABS: 20.0000 mg | ORAL_TABLET | Freq: Every day | ORAL | 0 refills | Status: DC

## 2023-04-10 NOTE — Telephone Encounter (Signed)
Refilled 90 day Last refill 09/27/2022 No upcoming appt.

## 2023-04-20 DIAGNOSIS — Z419 Encounter for procedure for purposes other than remedying health state, unspecified: Secondary | ICD-10-CM | POA: Diagnosis not present

## 2023-05-05 DIAGNOSIS — F3181 Bipolar II disorder: Secondary | ICD-10-CM | POA: Diagnosis not present

## 2023-06-16 DIAGNOSIS — F3181 Bipolar II disorder: Secondary | ICD-10-CM | POA: Diagnosis not present

## 2023-07-21 ENCOUNTER — Other Ambulatory Visit: Payer: Self-pay | Admitting: Family Medicine

## 2023-07-21 DIAGNOSIS — E785 Hyperlipidemia, unspecified: Secondary | ICD-10-CM

## 2023-09-23 ENCOUNTER — Encounter: Payer: Self-pay | Admitting: *Deleted
